# Patient Record
Sex: Male | Born: 1955
Health system: Southern US, Community
[De-identification: ages and names within clinical notes are randomized; demographics above are authoritative.]

## PROBLEM LIST (undated history)

## (undated) DIAGNOSIS — A150 Tuberculosis of lung: Secondary | ICD-10-CM

## (undated) DIAGNOSIS — M1990 Unspecified osteoarthritis, unspecified site: Secondary | ICD-10-CM

## (undated) HISTORY — DX: Unspecified osteoarthritis, unspecified site: M19.90

## (undated) HISTORY — PX: APPENDECTOMY: SHX54

## (undated) HISTORY — PX: HERNIA REPAIR: SHX51

---

## 1998-06-30 ENCOUNTER — Encounter: Payer: Self-pay | Admitting: Emergency Medicine

## 1998-06-30 ENCOUNTER — Emergency Department (HOSPITAL_COMMUNITY): Admission: EM | Admit: 1998-06-30 | Discharge: 1998-06-30 | Payer: Self-pay | Admitting: Emergency Medicine

## 2000-02-09 ENCOUNTER — Emergency Department (HOSPITAL_COMMUNITY): Admission: EM | Admit: 2000-02-09 | Discharge: 2000-02-09 | Payer: Self-pay

## 2000-06-29 ENCOUNTER — Emergency Department (HOSPITAL_COMMUNITY): Admission: EM | Admit: 2000-06-29 | Discharge: 2000-06-29 | Payer: Self-pay | Admitting: Emergency Medicine

## 2000-07-05 ENCOUNTER — Ambulatory Visit (HOSPITAL_COMMUNITY): Admission: RE | Admit: 2000-07-05 | Discharge: 2000-07-05 | Payer: Self-pay | Admitting: Obstetrics and Gynecology

## 2001-01-08 ENCOUNTER — Emergency Department (HOSPITAL_COMMUNITY): Admission: EM | Admit: 2001-01-08 | Discharge: 2001-01-08 | Payer: Self-pay | Admitting: Emergency Medicine

## 2002-04-15 ENCOUNTER — Emergency Department (HOSPITAL_COMMUNITY): Admission: EM | Admit: 2002-04-15 | Discharge: 2002-04-15 | Payer: Self-pay | Admitting: Emergency Medicine

## 2003-04-15 ENCOUNTER — Emergency Department (HOSPITAL_COMMUNITY): Admission: EM | Admit: 2003-04-15 | Discharge: 2003-04-15 | Payer: Self-pay | Admitting: Emergency Medicine

## 2003-09-28 ENCOUNTER — Emergency Department (HOSPITAL_COMMUNITY): Admission: EM | Admit: 2003-09-28 | Discharge: 2003-09-28 | Payer: Self-pay | Admitting: *Deleted

## 2004-02-15 ENCOUNTER — Emergency Department (HOSPITAL_COMMUNITY): Admission: EM | Admit: 2004-02-15 | Discharge: 2004-02-15 | Payer: Self-pay | Admitting: Family Medicine

## 2005-06-30 ENCOUNTER — Encounter: Admission: RE | Admit: 2005-06-30 | Discharge: 2005-06-30 | Payer: Self-pay | Admitting: Family Medicine

## 2006-03-19 ENCOUNTER — Emergency Department (HOSPITAL_COMMUNITY): Admission: EM | Admit: 2006-03-19 | Discharge: 2006-03-19 | Payer: Self-pay | Admitting: Emergency Medicine

## 2006-12-03 ENCOUNTER — Emergency Department (HOSPITAL_COMMUNITY): Admission: EM | Admit: 2006-12-03 | Discharge: 2006-12-03 | Payer: Self-pay | Admitting: Family Medicine

## 2008-01-23 ENCOUNTER — Emergency Department (HOSPITAL_COMMUNITY): Admission: EM | Admit: 2008-01-23 | Discharge: 2008-01-23 | Payer: Self-pay | Admitting: Family Medicine

## 2009-10-28 ENCOUNTER — Emergency Department (HOSPITAL_COMMUNITY): Admission: EM | Admit: 2009-10-28 | Discharge: 2009-10-28 | Payer: Self-pay | Admitting: Family Medicine

## 2010-01-05 ENCOUNTER — Emergency Department (HOSPITAL_COMMUNITY): Admission: EM | Admit: 2010-01-05 | Discharge: 2010-01-05 | Payer: Self-pay | Admitting: Family Medicine

## 2010-11-24 ENCOUNTER — Inpatient Hospital Stay (INDEPENDENT_AMBULATORY_CARE_PROVIDER_SITE_OTHER)
Admission: RE | Admit: 2010-11-24 | Discharge: 2010-11-24 | Disposition: A | Discharge status: Home / Self Care | Payer: Self-pay | Source: Ambulatory Visit | Attending: Emergency Medicine | Admitting: Emergency Medicine

## 2010-11-24 DIAGNOSIS — S335XXA Sprain of ligaments of lumbar spine, initial encounter: Secondary | ICD-10-CM

## 2012-09-04 ENCOUNTER — Encounter (HOSPITAL_COMMUNITY): Payer: Self-pay | Admitting: Emergency Medicine

## 2012-09-04 ENCOUNTER — Emergency Department (HOSPITAL_COMMUNITY)
Admission: EM | Admit: 2012-09-04 | Discharge: 2012-09-04 | Disposition: A | Discharge status: Home / Self Care | Payer: No Typology Code available for payment source | Attending: Emergency Medicine | Admitting: Emergency Medicine

## 2012-09-04 ENCOUNTER — Emergency Department (HOSPITAL_COMMUNITY): Payer: No Typology Code available for payment source

## 2012-09-04 DIAGNOSIS — A150 Tuberculosis of lung: Secondary | ICD-10-CM | POA: Insufficient documentation

## 2012-09-04 DIAGNOSIS — S6990XA Unspecified injury of unspecified wrist, hand and finger(s), initial encounter: Secondary | ICD-10-CM | POA: Insufficient documentation

## 2012-09-04 DIAGNOSIS — S59909A Unspecified injury of unspecified elbow, initial encounter: Secondary | ICD-10-CM | POA: Insufficient documentation

## 2012-09-04 DIAGNOSIS — S79919A Unspecified injury of unspecified hip, initial encounter: Secondary | ICD-10-CM | POA: Insufficient documentation

## 2012-09-04 DIAGNOSIS — Y929 Unspecified place or not applicable: Secondary | ICD-10-CM | POA: Insufficient documentation

## 2012-09-04 DIAGNOSIS — S99919A Unspecified injury of unspecified ankle, initial encounter: Secondary | ICD-10-CM

## 2012-09-04 DIAGNOSIS — S79929A Unspecified injury of unspecified thigh, initial encounter: Secondary | ICD-10-CM | POA: Insufficient documentation

## 2012-09-04 DIAGNOSIS — Z8611 Personal history of tuberculosis: Secondary | ICD-10-CM | POA: Insufficient documentation

## 2012-09-04 DIAGNOSIS — M25569 Pain in unspecified knee: Secondary | ICD-10-CM

## 2012-09-04 DIAGNOSIS — T148XXA Other injury of unspecified body region, initial encounter: Secondary | ICD-10-CM

## 2012-09-04 DIAGNOSIS — Y939 Activity, unspecified: Secondary | ICD-10-CM | POA: Insufficient documentation

## 2012-09-04 HISTORY — DX: Tuberculosis of lung: A15.0

## 2012-09-04 MED ORDER — OXYCODONE-ACETAMINOPHEN 5-325 MG PO TABS
2.0000 | ORAL_TABLET | Freq: Once | ORAL | Status: DC
  Filled 2012-09-04: qty 2

## 2012-09-04 MED ORDER — IBUPROFEN 800 MG PO TABS
800.0000 mg | ORAL_TABLET | Freq: Once | ORAL | Status: AC
  Administered 2012-09-04: 800 mg via ORAL
  Filled 2012-09-04: qty 1

## 2012-09-04 MED ORDER — OXYCODONE-ACETAMINOPHEN 5-325 MG PO TABS: 1.0000 | ORAL_TABLET | Freq: Four times a day (QID) | ORAL | Status: DC | PRN

## 2012-09-04 NOTE — ED Notes (Signed)
rx x 1 given for percocet- pt has family at bedside
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
## 2012-09-04 NOTE — ED Provider Notes (Signed)
  Medical screening examination/treatment/procedure(s) were performed by non-physician practitioner and as supervising physician I was immediately available for consultation/collaboration.    Gerhard Munch, MD 09/04/12 1540
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
## 2012-09-05 NOTE — Progress Notes (Signed)
During 09/04/12 ED visit pt noted without pcp nor insurance coverage.  Pt referred to [facility] liaison who provided pt with information on [place] self pay pcps and health reform information Pt also assisted to obtain an application and appointment for "orange card" for 09/10/12 at 1430

## 2012-10-16 ENCOUNTER — Encounter (HOSPITAL_COMMUNITY): Payer: Self-pay

## 2012-10-16 ENCOUNTER — Ambulatory Visit (HOSPITAL_COMMUNITY)
Admission: RE | Admit: 2012-10-16 | Discharge: 2012-10-16 | Disposition: A | Discharge status: Home / Self Care | Payer: Self-pay | Source: Ambulatory Visit | Attending: Internal Medicine | Admitting: Internal Medicine

## 2012-10-16 ENCOUNTER — Emergency Department (HOSPITAL_COMMUNITY)
Admission: EM | Admit: 2012-10-16 | Discharge: 2012-10-16 | Disposition: A | Discharge status: Home / Self Care | Payer: Self-pay | Source: Home / Self Care

## 2012-10-16 DIAGNOSIS — M7918 Myalgia, other site: Secondary | ICD-10-CM

## 2012-10-16 DIAGNOSIS — IMO0001 Reserved for inherently not codable concepts without codable children: Secondary | ICD-10-CM

## 2012-10-16 MED ORDER — IBUPROFEN 600 MG PO TABS: 600.0000 mg | ORAL_TABLET | Freq: Three times a day (TID) | ORAL | Status: DC | PRN
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
## 2012-10-16 NOTE — ED Notes (Signed)
Patient suffers from pain in arm and shuoulder Can sleep on either shoulder at night Lump under tongue on right side

## 2012-10-31 ENCOUNTER — Ambulatory Visit (HOSPITAL_COMMUNITY)
Admission: RE | Admit: 2012-10-31 | Discharge: 2012-10-31 | Disposition: A | Discharge status: Home / Self Care | Payer: Self-pay | Source: Ambulatory Visit | Attending: Internal Medicine | Admitting: Internal Medicine

## 2012-10-31 ENCOUNTER — Encounter (HOSPITAL_COMMUNITY): Payer: Self-pay

## 2012-10-31 ENCOUNTER — Emergency Department (HOSPITAL_COMMUNITY)
Admission: EM | Admit: 2012-10-31 | Discharge: 2012-10-31 | Disposition: A | Discharge status: Home / Self Care | Payer: Self-pay | Source: Home / Self Care

## 2012-10-31 DIAGNOSIS — M25519 Pain in unspecified shoulder: Secondary | ICD-10-CM | POA: Insufficient documentation

## 2012-10-31 DIAGNOSIS — M7918 Myalgia, other site: Secondary | ICD-10-CM

## 2012-10-31 DIAGNOSIS — IMO0001 Reserved for inherently not codable concepts without codable children: Secondary | ICD-10-CM

## 2012-10-31 MED ORDER — IBUPROFEN 600 MG PO TABS: 600.0000 mg | ORAL_TABLET | Freq: Three times a day (TID) | ORAL | Status: DC | PRN

## 2012-10-31 NOTE — ED Notes (Signed)
Follow up-bilateral shoulder pain
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
## 2012-12-17 ENCOUNTER — Telehealth: Payer: Self-pay | Admitting: General Practice

## 2012-12-17 NOTE — Telephone Encounter (Signed)
Pt would like refill for pain meds prescribed on 10/31/12 before 5/31 follow up.  Says ibuprofen not working, can't sleep because of pain.

## 2012-12-18 NOTE — Telephone Encounter (Signed)
Patient needs to be seen Will not change pain medication until he has an office visit

## 2013-07-11 ENCOUNTER — Encounter (HOSPITAL_COMMUNITY): Payer: Self-pay | Admitting: Emergency Medicine

## 2013-07-11 ENCOUNTER — Emergency Department (INDEPENDENT_AMBULATORY_CARE_PROVIDER_SITE_OTHER)
Admission: EM | Admit: 2013-07-11 | Discharge: 2013-07-11 | Disposition: A | Discharge status: Home / Self Care | Payer: Self-pay | Source: Home / Self Care | Attending: Family Medicine | Admitting: Family Medicine

## 2013-07-11 DIAGNOSIS — J069 Acute upper respiratory infection, unspecified: Secondary | ICD-10-CM

## 2013-07-11 MED ORDER — METHYLPREDNISOLONE SODIUM SUCC 125 MG IJ SOLR
INTRAMUSCULAR | Status: AC
  Filled 2013-07-11: qty 2

## 2013-07-11 MED ORDER — HYDROCOD POLST-CHLORPHEN POLST 10-8 MG/5ML PO LQCR: 5.0000 mL | Freq: Every evening | ORAL | Status: DC | PRN

## 2013-07-11 MED ORDER — METHYLPREDNISOLONE SODIUM SUCC 125 MG IJ SOLR
80.0000 mg | Freq: Once | INTRAMUSCULAR | Status: AC
Start: 2013-07-11 — End: 2013-07-11
  Administered 2013-07-11: 80 mg via INTRAMUSCULAR

## 2013-07-11 MED ORDER — BENZONATATE 100 MG PO CAPS: 100.0000 mg | ORAL_CAPSULE | Freq: Three times a day (TID) | ORAL | Status: DC

## 2013-07-11 NOTE — ED Notes (Signed)
C/o 1 week duration of URI , minimal relief w OTC medications

## 2013-07-11 NOTE — ED Provider Notes (Signed)
CSN: [id number]     Arrival date & time 07/11/13  1191 History   First MD Initiated Contact with Patient 07/11/13 1202     Chief Complaint  Patient presents with  . URI    Patient is a 57 y.o. male presenting with URI. The history is provided by the patient.  URI Presenting symptoms: congestion, cough and rhinorrhea   Presenting symptoms: no ear pain, no facial pain, no fatigue, no fever and no sore throat   Severity:  Moderate Onset quality:  Gradual Duration:  1 week Timing:  Constant Progression:  Unchanged Chronicity:  New Relieved by:  Nothing Ineffective treatments:  OTC medications Associated symptoms: headaches and sneezing   Associated symptoms: no arthralgias, no neck pain, no sinus pain, no swollen glands and no wheezing   Risk factors: not elderly, no chronic cardiac disease, no chronic kidney disease, no chronic respiratory disease, no diabetes mellitus, no immunosuppression, no recent illness, no recent travel and no sick contacts   1 Week h/o URI sx's that have persisted and have not responded to OTC remedies. Denies fever, body aches, facial pain or ear pain. Reports a  dry cough that is worse at night. Describes copiopus nasal secretions and PND.  Denies N/V/D or other associated symptoms.   Past Medical History  Diagnosis Date  . TB (pulmonary tuberculosis)    Past Surgical History  Procedure Laterality Date  . Appendectomy    . Hernia repair     Family History  Problem Relation Age of Onset  . Hypertension Mother   . Cancer Mother    History  Substance Use Topics  . Smoking status: Never Smoker   . Smokeless tobacco: Not on file  . Alcohol Use: Yes    Review of Systems  Constitutional: Negative for fever, chills and fatigue.  HENT: Positive for congestion, postnasal drip, rhinorrhea and sneezing. Negative for ear discharge, ear pain, sinus pressure, sore throat, trouble swallowing and voice change.   Eyes: Negative.   Respiratory: Positive for  cough. Negative for wheezing.   Cardiovascular: Negative.   Gastrointestinal: Negative.   Endocrine: Negative.   Genitourinary: Negative.   Musculoskeletal: Negative.  Negative for arthralgias and neck pain.  Allergic/Immunologic: Negative.   Neurological: Positive for headaches.  Hematological: Negative.   Psychiatric/Behavioral: Negative.     Allergies  Review of patient's allergies indicates no known allergies.  Home Medications   Current Outpatient Rx  Name  Route  Sig  Dispense  Refill  . benzonatate (TESSALON) 100 MG capsule   Oral   Take 1 capsule (100 mg total) by mouth every 8 (eight) hours.   21 capsule   0   . chlorpheniramine-HYDROcodone (TUSSIONEX PENNKINETIC ER) 10-8 MG/5ML LQCR   Oral   Take 5 mLs by mouth at bedtime as needed for cough.   30 mL   0   . ibuprofen (ADVIL,MOTRIN) 600 MG tablet   Oral   Take 1 tablet (600 mg total) by mouth every 8 (eight) hours as needed for pain.   30 tablet   0    BP 129/82  Pulse 69  Temp(Src) 98.4 F (36.9 C) (Oral)  SpO2 96% Physical Exam  Constitutional: He is oriented to person, place, and time. He appears well-developed and well-nourished.  HENT:  Head: Normocephalic and atraumatic.  Right Ear: Tympanic membrane, external ear and ear canal normal.  Left Ear: Tympanic membrane and ear canal normal.  Nose: Nose normal. Right sinus exhibits no maxillary sinus tenderness and  no frontal sinus tenderness. Left sinus exhibits no maxillary sinus tenderness and no frontal sinus tenderness.  Mouth/Throat: Uvula is midline, oropharynx is clear and moist and mucous membranes are normal.  Eyes: Conjunctivae are normal.  Neck: Normal range of motion. Neck supple.  Cardiovascular: Normal rate and regular rhythm.   Pulmonary/Chest: Effort normal and breath sounds normal.  Musculoskeletal: Normal range of motion.  Lymphadenopathy:    He has no cervical adenopathy.  Neurological: He is alert and oriented to person, place,  and time.  Skin: Skin is warm and dry.  Psychiatric: He has a normal mood and affect.    ED Course  Procedures (including critical care time) Labs Review Labs Reviewed - No data to display Imaging Review No results found.  EKG Interpretation    Date/Time:    Ventricular Rate:    PR Interval:    QRS Duration:   QT Interval:    QTC Calculation:   R Axis:     Text Interpretation:              MDM   1. URI, acute    1 wk h/o persistent URI sx's c/w viral URI. Solu-Medrol 80 mg given IM in office. Will treat w/ Tessalon Pearles, Tussionex for use at night and encourage rest, fluids and tylenol and Ibuprofen as needed. Pt agreeable w/ plan.    Leanne Chang, NP 07/11/13 1257  Medical screening examination/treatment/procedure(s) were performed by a resident physician or non-physician practitioner and as the supervising physician I was immediately available for consultation/collaboration.  Clementeen Graham, MD    Rodolph Bong, MD 07/12/13 [phone]

## 2013-11-21 ENCOUNTER — Ambulatory Visit: Payer: Self-pay

## 2014-01-14 ENCOUNTER — Ambulatory Visit: Payer: Self-pay | Admitting: Internal Medicine

## 2015-02-13 ENCOUNTER — Encounter (HOSPITAL_COMMUNITY): Payer: Self-pay | Admitting: *Deleted

## 2015-02-13 ENCOUNTER — Emergency Department (HOSPITAL_COMMUNITY)
Admission: EM | Admit: 2015-02-13 | Discharge: 2015-02-13 | Disposition: A | Discharge status: Home / Self Care | Payer: Worker's Compensation | Attending: Emergency Medicine | Admitting: Emergency Medicine

## 2015-02-13 DIAGNOSIS — Z8619 Personal history of other infectious and parasitic diseases: Secondary | ICD-10-CM | POA: Diagnosis not present

## 2015-02-13 DIAGNOSIS — W458XXA Other foreign body or object entering through skin, initial encounter: Secondary | ICD-10-CM | POA: Diagnosis not present

## 2015-02-13 DIAGNOSIS — Y998 Other external cause status: Secondary | ICD-10-CM | POA: Diagnosis not present

## 2015-02-13 DIAGNOSIS — Y9289 Other specified places as the place of occurrence of the external cause: Secondary | ICD-10-CM | POA: Diagnosis not present

## 2015-02-13 DIAGNOSIS — Z23 Encounter for immunization: Secondary | ICD-10-CM | POA: Diagnosis not present

## 2015-02-13 DIAGNOSIS — IMO0002 Reserved for concepts with insufficient information to code with codable children: Secondary | ICD-10-CM

## 2015-02-13 DIAGNOSIS — S51811A Laceration without foreign body of right forearm, initial encounter: Secondary | ICD-10-CM | POA: Insufficient documentation

## 2015-02-13 DIAGNOSIS — Y9389 Activity, other specified: Secondary | ICD-10-CM | POA: Diagnosis not present

## 2015-02-13 MED ORDER — IBUPROFEN 800 MG PO TABS: 800.0000 mg | ORAL_TABLET | Freq: Three times a day (TID) | ORAL | Status: DC

## 2015-02-13 MED ORDER — TETANUS-DIPHTH-ACELL PERTUSSIS 5-2.5-18.5 LF-MCG/0.5 IM SUSP
0.5000 mL | Freq: Once | INTRAMUSCULAR | Status: AC
  Administered 2015-02-13: 0.5 mL via INTRAMUSCULAR
  Filled 2015-02-13: qty 0.5

## 2015-02-13 MED ORDER — LIDOCAINE-EPINEPHRINE (PF) 2 %-1:200000 IJ SOLN
20.0000 mL | Freq: Once | INTRAMUSCULAR | Status: AC
  Administered 2015-02-13: 20 mL
  Filled 2015-02-13: qty 20

## 2015-02-13 NOTE — ED Provider Notes (Signed)
CSN: [id number]     Arrival date & time 02/13/15  1421 History  This chart was scribed for Brent General, PA-C, working with  by Steva Colder, ED Scribe. The patient was seen in room TR06C/TR06C at 3:25 PM.     Chief Complaint  Patient presents with  . Laceration      The history is provided by the patient. No language interpreter was used.    HPI Comments: Melvin Michael is a 59 y.o. male who presents to the Emergency Department complaining of laceration to left wrist onset PTA. He reports that he was trying to change out the plates on his trailer when his hand slipped and the plate cut his wrist. He denies color change, joint swelling, and any other symptoms. Pt is not UTD on his tetanus.  Past Medical History  Diagnosis Date  . TB (pulmonary tuberculosis)    Past Surgical History  Procedure Laterality Date  . Appendectomy    . Hernia repair     Family History  Problem Relation Age of Onset  . Hypertension Mother   . Cancer Mother    History  Substance Use Topics  . Smoking status: Never Smoker   . Smokeless tobacco: Not on file  . Alcohol Use: Yes    Review of Systems  Musculoskeletal: Negative for joint swelling.  Skin: Positive for wound. Negative for color change and rash.  Neurological: Negative for numbness.    Allergies  Review of patient's allergies indicates no known allergies.  Home Medications   Prior to Admission medications   Medication Sig Start Date End Date Taking? Authorizing Provider  benzonatate (TESSALON) 100 MG capsule Take 1 capsule (100 mg total) by mouth every 8 (eight) hours. 07/11/13   Rhetta Mura Schorr, NP  chlorpheniramine-HYDROcodone (TUSSIONEX PENNKINETIC ER) 10-8 MG/5ML LQCR Take 5 mLs by mouth at bedtime as needed for cough. 07/11/13   Rhetta Mura Schorr, NP  ibuprofen (ADVIL,MOTRIN) 600 MG tablet Take 1 tablet (600 mg total) by mouth every 8 (eight) hours as needed for pain. 10/31/12   Thurnell Lose, MD  ibuprofen (ADVIL,MOTRIN)  800 MG tablet Take 1 tablet (800 mg total) by mouth 3 (three) times daily. 02/13/15   Dahlia Bailiff, PA-C   BP 134/97 mmHg  Pulse 61  Temp(Src) 98 F (36.7 C) (Oral)  Resp 16  SpO2 100% Physical Exam  Constitutional: He is oriented to person, place, and time. He appears well-developed and well-nourished. No distress.  HENT:  Head: Normocephalic and atraumatic.  Eyes: EOM are normal.  Neck: Neck supple. No tracheal deviation present.  Cardiovascular: Normal rate.   Pulmonary/Chest: Effort normal. No respiratory distress.  Musculoskeletal: Normal range of motion.  Neurological: He is alert and oriented to person, place, and time.  Skin: Skin is warm and dry. Laceration noted.  4 cm laceration noted to the volar aspect of right distal forearm. Patient has full active and passive range of motion of elbow and wrist. Patient has full range of motion of fingers. 5 out of 5 motor strength noted at shoulder, elbow, wrist, grip. Radial pulse 2+.  Psychiatric: He has a normal mood and affect. His behavior is normal.  Nursing note and vitals reviewed.   ED Course  LACERATION REPAIR Date/Time: 02/13/2015 5:44 PM Performed by: Dahlia Bailiff Authorized by: Dahlia Bailiff Consent: Verbal consent obtained. Risks and benefits: risks, benefits and alternatives were discussed Consent given by: patient Patient identity confirmed: verbally with patient Time out: Immediately prior to procedure a "time  out" was called to verify the correct patient, procedure, equipment, support staff and site/side marked as required. Body area: upper extremity Location details: left lower arm Laceration length: 4 cm Foreign bodies: no foreign bodies Tendon involvement: none Nerve involvement: none Vascular damage: no Local anesthetic: lidocaine 2% with epinephrine Anesthetic total: 5 ml Patient sedated: no Preparation: Patient was prepped and draped in the usual sterile fashion. Irrigation solution: saline Irrigation  method: jet lavage and syringe Amount of cleaning: standard Debridement: none Degree of undermining: none Skin closure: 4-0 Prolene Fascia closure: 4-0 Vicryl Number of sutures: 7 Technique: simple Approximation: close Approximation difficulty: complex Dressing: 4x4 sterile gauze Patient tolerance: Patient tolerated the procedure well with no immediate complications Comments: 4-0 Vicryls rapide #1 placed deep and fascia. 4-0 #6 proline sutures placed in the skin superficially   (including critical care time) DIAGNOSTIC STUDIES: Oxygen Saturation is 100% on RA, nl by my interpretation.    COORDINATION OF CARE: 3:28 PM-Discussed treatment plan which includes laceration repair with pt at bedside and pt agreed to plan.   Labs Review Labs Reviewed - No data to display  Imaging Review No results found.   EKG Interpretation None      MDM   Final diagnoses:  Laceration of skin   Tdap booster given. Wound cleaning complete with pressure irrigation, bottom of wound visualized, no foreign bodies appreciated. Laceration occurred < 8 hours prior to repair which was well tolerated. Pt has no co morbidities to effect normal wound healing. Discussed suture home care w pt and answered questions. Pt to f-u for wound check and suture removal in 7 days. Pt is hemodynamically stable w no complaints prior to dc.   I personally performed the services described in this documentation, which was scribed in my presence. The recorded information has been reviewed and is accurate.  BP 134/97 mmHg  Pulse 61  Temp(Src) 98 F (36.7 C) (Oral)  Resp 16  SpO2 100%  Signed,  Dahlia Bailiff, PA-C 5:48 PM    Dahlia Bailiff, PA-C 02/13/15 1748  Ripley Fraise, MD 02/14/15 [phone]

## 2015-02-13 NOTE — ED Notes (Signed)
Suture cart at bedside 

## 2015-02-13 NOTE — ED Notes (Signed)
Declined W/C at D/C and was escorted to lobby by RN. 

## 2015-02-13 NOTE — Discharge Instructions (Signed)
Sutured Wound Care Sutures are stitches that can be used to close wounds. Wound care helps prevent pain and infection.  HOME CARE INSTRUCTIONS   Rest and elevate the injured area until all the pain and swelling are gone.  Only take over-the-counter or prescription medicines for pain, discomfort, or fever as directed by your caregiver.  After 48 hours, gently wash the area with mild soap and water once a day, or as directed. Rinse off the soap. Pat the area dry with a clean towel. Do not rub the wound. This may cause bleeding.  Follow your caregiver's instructions for how often to change the bandage (dressing). Stop using a dressing after 2 days or after the wound stops draining.  If the dressing sticks, moisten it with soapy water and gently remove it.  Apply ointment on the wound as directed.  Avoid stretching a sutured wound.  Drink enough fluids to keep your urine clear or pale yellow.  Follow up with your caregiver for suture removal as directed.  Use sunscreen on your wound for the next 3 to 6 months so the scar will not darken. SEEK IMMEDIATE MEDICAL CARE IF:   Your wound becomes red, swollen, hot, or tender.  You have increasing pain in the wound.  You have a red streak that extends from the wound.  There is pus coming from the wound.  You have a fever.  You have shaking chills.  There is a bad smell coming from the wound.  You have persistent bleeding from the wound. MAKE SURE YOU:   Understand these instructions.  Will watch your condition.  Will get help right away if you are not doing well or get worse. Document Released: 08/24/2004 Document Revised: 10/09/2011 Document Reviewed: 11/20/2010 [facility] Patient Information 2015 [place], [place]. This information is not intended to replace advice given to you by your health care provider. Make sure you discuss any questions you have with your health care provider.  Laceration Care, Adult A laceration is a cut  or lesion that goes through all layers of the skin and into the tissue just beneath the skin. TREATMENT  Some lacerations may not require closure. Some lacerations may not be able to be closed due to an increased risk of infection. It is important to see your caregiver as soon as possible after an injury to minimize the risk of infection and maximize the opportunity for successful closure. If closure is appropriate, pain medicines may be given, if needed. The wound will be cleaned to help prevent infection. Your caregiver will use stitches (sutures), staples, wound glue (adhesive), or skin adhesive strips to repair the laceration. These tools bring the skin edges together to allow for faster healing and a better cosmetic outcome. However, all wounds will heal with a scar. Once the wound has healed, scarring can be minimized by covering the wound with sunscreen during the day for 1 full year. HOME CARE INSTRUCTIONS  For sutures or staples:  Keep the wound clean and dry.  If you were given a bandage (dressing), you should change it at least once a day. Also, change the dressing if it becomes wet or dirty, or as directed by your caregiver.  Wash the wound with soap and water 2 times a day. Rinse the wound off with water to remove all soap. Pat the wound dry with a clean towel.  After cleaning, apply a thin layer of the antibiotic ointment as recommended by your caregiver. This will help prevent infection and keep  the dressing from sticking.  You may shower as usual after the first 24 hours. Do not soak the wound in water until the sutures are removed.  Only take over-the-counter or prescription medicines for pain, discomfort, or fever as directed by your caregiver.  Get your sutures or staples removed as directed by your caregiver. For skin adhesive strips:  Keep the wound clean and dry.  Do not get the skin adhesive strips wet. You may bathe carefully, using caution to keep the wound dry.  If  the wound gets wet, pat it dry with a clean towel.  Skin adhesive strips will fall off on their own. You may trim the strips as the wound heals. Do not remove skin adhesive strips that are still stuck to the wound. They will fall off in time. For wound adhesive:  You may briefly wet your wound in the shower or bath. Do not soak or scrub the wound. Do not swim. Avoid periods of heavy perspiration until the skin adhesive has fallen off on its own. After showering or bathing, gently pat the wound dry with a clean towel.  Do not apply liquid medicine, cream medicine, or ointment medicine to your wound while the skin adhesive is in place. This may loosen the film before your wound is healed.  If a dressing is placed over the wound, be careful not to apply tape directly over the skin adhesive. This may cause the adhesive to be pulled off before the wound is healed.  Avoid prolonged exposure to sunlight or tanning lamps while the skin adhesive is in place. Exposure to ultraviolet light in the first year will darken the scar.  The skin adhesive will usually remain in place for 5 to 10 days, then naturally fall off the skin. Do not pick at the adhesive film. You may need a tetanus shot if:  You cannot remember when you had your last tetanus shot.  You have never had a tetanus shot. If you get a tetanus shot, your arm may swell, get red, and feel warm to the touch. This is common and not a problem. If you need a tetanus shot and you choose not to have one, there is a rare chance of getting tetanus. Sickness from tetanus can be serious. SEEK MEDICAL CARE IF:   You have redness, swelling, or increasing pain in the wound.  You see a red line that goes away from the wound.  You have yellowish-white fluid (pus) coming from the wound.  You have a fever.  You notice a bad smell coming from the wound or dressing.  Your wound breaks open before or after sutures have been removed.  You notice something  coming out of the wound such as wood or glass.  Your wound is on your hand or foot and you cannot move a finger or toe. SEEK IMMEDIATE MEDICAL CARE IF:   Your pain is not controlled with prescribed medicine.  You have severe swelling around the wound causing pain and numbness or a change in color in your arm, hand, leg, or foot.  Your wound splits open and starts bleeding.  You have worsening numbness, weakness, or loss of function of any joint around or beyond the wound.  You develop painful lumps near the wound or on the skin anywhere on your body. MAKE SURE YOU:   Understand these instructions.  Will watch your condition.  Will get help right away if you are not doing well or get worse. Document Released:  07/17/2005 Document Revised: 10/09/2011 Document Reviewed: 01/10/2011 ExitCare Patient Information 2015 [place], [place]. This information is not intended to replace advice given to you by your health care provider. Make sure you discuss any questions you have with your health care provider.   Emergency Department Resource Guide 1) Find a Doctor and Pay Out of Pocket Although you won't have to find out who is covered by your insurance plan, it is a good idea to ask around and get recommendations. You will then need to call the office and see if the doctor you have chosen will accept you as a new patient and what types of options they offer for patients who are self-pay. Some doctors offer discounts or will set up payment plans for their patients who do not have insurance, but you will need to ask so you aren't surprised when you get to your appointment.  2) Contact Your Local Health Department Not all health departments have doctors that can see patients for sick visits, but many do, so it is worth a call to see if yours does. If you don't know where your local health department is, you can check in your phone book. The CDC also has a tool to help you locate your state's health  department, and many state websites also have listings of all of their local health departments.  3) Find a [facility] If your illness is not likely to be very severe or complicated, you may want to try a walk in clinic. These are popping up all over the country in pharmacies, drugstores, and shopping centers. They're usually staffed by nurse practitioners or physician assistants that have been trained to treat common illnesses and complaints. They're usually fairly quick and inexpensive. However, if you have serious medical issues or chronic medical problems, these are probably not your best option.  No Primary Care Doctor: - Call Health Connect at  [phone] - they can help you locate a primary care doctor that  accepts your insurance, provides certain services, etc. - Physician Referral Service- [phone]  Chronic Pain Problems: Organization         Address  Phone   Notes  [facility]  [phone] Patients need to be referred by their primary care doctor.   Medication Assistance: Organization         Address  Phone   Notes  [facility] Medication [facility] [address] [place], [place] [address] [phone] --Must be a resident of [facility] [place] -- Must have NO insurance coverage whatsoever (no Medicaid/ Medicare, etc.) -- The pt. MUST have a primary care doctor that directs their care regularly and follows them in the community   MedAssist  [phone]   [employer]  [phone]    Agencies that provide inexpensive medical care: Organization         Address  Phone   Notes  [place]  [phone]   [facility]    [phone]   [facility] [address], [place] [address] [phone]   [place] [address], [place] [phone]   Planned Parenthood    [phone]   [facility]    [phone]    [address], [place] Phone:  [phone], Fax:  [phone] Hours of Operation:  9 am - 6 pm, M-F.  Also  accepts Medicaid/Medicare and self-pay.  [facility] for [address] [place], [address], [place] Phone: [phone], Fax: [phone]. Hours of Operation:  8:30 am - 5:30 pm, M-F.  Also accepts Medicaid and self-pay.  [facility] [place] [address], [place] Phone: [phone]   [place], [place], [place] [phone], Ext. [phone] Mondays & Thursdays: 7-9 AM.  First 15 patients are seen on a first come, first serve basis.    [place] Providers:  Organization         Address  Phone   Notes  [facility] [address], [place] [phone] Also accepts self-pay patients.  [facility] [address], [place]  [phone]   [place], [address], [place] [phone]   [facility] [address] [place] [phone]   Lucianne Lei [address], [place]   [phone] Only accepts [place] Access [place] patients after they have their name applied to their card.   Self-Pay (no insurance) in [facility]:  Organization         Address  Phone   Notes  Sickle Cell Patients, [facility] [place] [phone]   [facility] [place] [phone]   [facility] [place]  [place], [place],  [phone]   [facility]/Dr. Osei-Bonsu  [address] [place] or [address], [place] [phone] Phone number for both [place] and [place] locations is the same.  Urgent Medical and [facility] [address] [place] [phone]    [facility] [address], [place] or [address] [phone]   [facility] [address], [place] [phone], phone; [phone], fax Sees patients 1st and 3rd Saturday of every month.  Must not qualify for public or private insurance (i.e. Medicaid, Medicare, [place] Health Choice, Veterans' Benefits)  Household income should be no more than 200% of the poverty level The clinic cannot treat you if you are pregnant or think you are pregnant  Sexually transmitted diseases are not treated at the clinic.    Dental Care: Organization         Address  Phone  Notes  [facility] Department of [facility] [place] [phone] Accepts children up to age 47 who are enrolled in [place] or [place]; pregnant women with a Medicaid card; and children who have applied for Medicaid or [place] Health Choice, but were declined, whose parents can pay a reduced fee at time of service.  [facility] Department of [facility]  [address], [place] [phone] Accepts children up to age 7 who are enrolled in [place] or [place]; pregnant women with a Medicaid card; and children who have applied for Medicaid or [place] Health Choice, but were declined, whose parents can pay a reduced fee at time of service.  [place] Adult [facility] Access PROGRAM  [place] [phone] Patients are seen by appointment only. Walk-ins are not accepted. [place] will see patients 51 years of age and older. Monday - Tuesday (8am-5pm) Most  Wednesdays (8:30-5pm) $30 per visit, cash only  [facility] Adult Dental Access PROGRAM  [address], [facility] [phone] Patients are seen by appointment only. Walk-ins are not accepted. [place] will see patients 66 years of age and older. One Wednesday Evening (Monthly: Volunteer Based).   $30 per visit, cash only  [place]  [phone] for adults; Children under age 35, call Graduate Pediatric Dentistry at [phone]. Children aged 49-14, please call [phone] to request a pediatric application.  Dental services are provided in all areas of dental care including fillings, crowns and bridges, complete and partial dentures, implants, gum treatment, root canals, and extractions. Preventive care is also provided. Treatment is provided to both adults and children. Patients are selected via a lottery and there is often a waiting list.   [facility] [address] [place]  [phone] [email or web address]   [facility] [address] [place], [place] [phone], Ext. [phone] Second and Fourth Thursday of each month, opens at 6:30 AM; Clinic ends at 9 AM.  Patients are seen on a first-come first-served basis, and a limited number are seen during each clinic.   [facility]  [address] Hillard Danker [place], [place] [phone]   Eligibility Requirements You must have lived in [place], [place], or [place] counties for at least the last three months.   You cannot be eligible for state or federal sponsored [employer], including [employer], [place], or [employer].   You generally cannot be eligible for healthcare insurance through your employer.    How to apply: Eligibility screenings are held every Tuesday and Wednesday afternoon from 1:00 pm until 4:00 pm. You do not need an appointment for the interview!  [facility] [address], [place], [place]   [place]  [place] Department  [place]  [phone]    Behavioral Health Resources in the Community: Intensive Outpatient Programs Organization         Address  Phone  Notes  [place]. [address] [place], [place] [phone]   [facility] Outpatient [address] [place], [place]   ADS: Alcohol & Drug Svcs [address] [place], [place]   [place] [address]  [place], [place] or [phone]   Substance Abuse Resources Organization         Address  Phone  Notes  Alcohol and Drug Services  [phone]   [place]  [phone]   [facility] [place]   Chinita Pester  [phone]   Residential & Outpatient Substance Abuse Program  [phone]   Psychological Services Organization         Address  Phone  Notes  [facility] [place]  [place]  [phone]   [place] [address] [place] or [phone]    Mobile Crisis Teams Organization         Address  Phone  Notes  [facility], Mobile Crisis Care Unit  [phone]   [facility]  [address] [place], [place]   [place] [address] [place] [phone]    Self-Help/Support Groups Organization         Address  Phone  Notes  [facility]ental [facility]oc[facility] of [place] - variety of support groups  [place] Call for more information  Narcotics Anonymous (NA), [facility] [address], [employer] [place]  2 meetings at this location   Special educational needs teacher         Address  Phone  Notes  [facility] [place],    [place]  [phone]   [facility]  [address] [place] [id number], [place], [place]ia   [place], [place] [phone] Admissions: 8am-3pm M-F  Incentives Substance [place] [address],    [place], [place] [phone]   [facility] [address] [place], [place], [place]   [facility] [address],  [place], [place]   [facility] Programs - Intensive Outpatient [address] Kristeen [address], [place], [place]   [facility] [place].,  [place], [place] [phone] or [phone]   [facility]idential Treatment Services (RTS) [address] [place], [place] Accepts Medicaid  [facility] [place] [address],  [employer] [place] [phone] Substance Abuse/Addiction Treatment   [facility] Organization         Address  Phone  Notes  [facility]  [phone]   Domenic Schwab, PhD [address] Arlis Porta [place], [place]   [phone] or [phone]   [place], [place] [phone]   [facility] [address] [place], [place] [phone] Insurance/Medicaid/sponsorship through [facility] and Families [address] [place]                                    [place], [place] [phone] [place] [address], [place] [phone]    Dr. Adele Schilder  [phone]   [facility] Dept. 1) [address] [place] 2) [place] 3)  [place] [address], [place] [phone]  [phone]   [place] [phone] or [phone] (After Hours)

## 2015-02-13 NOTE — ED Notes (Signed)
Pt has approx 1 inch laceration to left anterior wrist, minimal bleeding noted at triage. Pt able to move all fingers and denies any numbness. Unknown tetanus.

## 2015-02-20 ENCOUNTER — Encounter (HOSPITAL_COMMUNITY): Payer: Self-pay | Admitting: Emergency Medicine

## 2015-02-20 ENCOUNTER — Emergency Department (HOSPITAL_COMMUNITY)
Admission: EM | Admit: 2015-02-20 | Discharge: 2015-02-20 | Disposition: A | Discharge status: Home / Self Care | Payer: Self-pay | Attending: Emergency Medicine | Admitting: Emergency Medicine

## 2015-02-20 DIAGNOSIS — Z79899 Other long term (current) drug therapy: Secondary | ICD-10-CM | POA: Insufficient documentation

## 2015-02-20 DIAGNOSIS — Z8611 Personal history of tuberculosis: Secondary | ICD-10-CM | POA: Insufficient documentation

## 2015-02-20 DIAGNOSIS — Z4802 Encounter for removal of sutures: Secondary | ICD-10-CM | POA: Insufficient documentation

## 2015-02-20 DIAGNOSIS — Z5189 Encounter for other specified aftercare: Secondary | ICD-10-CM

## 2015-02-20 DIAGNOSIS — Z791 Long term (current) use of non-steroidal anti-inflammatories (NSAID): Secondary | ICD-10-CM | POA: Insufficient documentation

## 2015-02-20 DIAGNOSIS — M5432 Sciatica, left side: Secondary | ICD-10-CM | POA: Insufficient documentation

## 2015-02-20 MED ORDER — IBUPROFEN 400 MG PO TABS
600.0000 mg | ORAL_TABLET | Freq: Once | ORAL | Status: AC
  Administered 2015-02-20: 600 mg via ORAL
  Filled 2015-02-20: qty 2

## 2015-02-20 MED ORDER — IBUPROFEN 600 MG PO TABS: 600.0000 mg | ORAL_TABLET | Freq: Four times a day (QID) | ORAL | Status: DC | PRN

## 2015-02-20 MED ORDER — CYCLOBENZAPRINE HCL 5 MG PO TABS: 5.0000 mg | ORAL_TABLET | Freq: Two times a day (BID) | ORAL | Status: DC | PRN

## 2015-02-20 NOTE — Discharge Instructions (Signed)
Sciatica °Sciatica is pain, weakness, numbness, or tingling along the path of the sciatic nerve. The nerve starts in the lower back and runs down the back of each leg. The nerve controls the muscles in the lower leg and in the back of the knee, while also providing sensation to the back of the thigh, lower leg, and the sole of your foot. Sciatica is a symptom of another medical condition. For instance, nerve damage or certain conditions, such as a herniated disk or bone spur on the spine, pinch or put pressure on the sciatic nerve. This causes the pain, weakness, or other sensations normally associated with sciatica. Generally, sciatica only affects one side of the body. °CAUSES  °· Herniated or slipped disc. °· Degenerative disk disease. °· A pain disorder involving the narrow muscle in the buttocks (piriformis syndrome). °· Pelvic injury or fracture. °· Pregnancy. °· Tumor (rare). °SYMPTOMS  °Symptoms can vary from mild to very severe. The symptoms usually travel from the low back to the buttocks and down the back of the leg. Symptoms can include: °· Mild tingling or dull aches in the lower back, leg, or hip. °· Numbness in the back of the calf or sole of the foot. °· Burning sensations in the lower back, leg, or hip. °· Sharp pains in the lower back, leg, or hip. °· Leg weakness. °· Severe back pain inhibiting movement. °These symptoms may get worse with coughing, sneezing, laughing, or prolonged sitting or standing. Also, being overweight may worsen symptoms. °DIAGNOSIS  °Your caregiver will perform a physical exam to look for common symptoms of sciatica. He or she may ask you to do certain movements or activities that would trigger sciatic nerve pain. Other tests may be performed to find the cause of the sciatica. These may include: °· Blood tests. °· X-rays. °· Imaging tests, such as an MRI or CT scan. °TREATMENT  °Treatment is directed at the cause of the sciatic pain. Sometimes, treatment is not necessary  and the pain and discomfort goes away on its own. If treatment is needed, your caregiver may suggest: °· Over-the-counter medicines to relieve pain. °· Prescription medicines, such as anti-inflammatory medicine, muscle relaxants, or narcotics. °· Applying heat or ice to the painful area. °· Steroid injections to lessen pain, irritation, and inflammation around the nerve. °· Reducing activity during periods of pain. °· Exercising and stretching to strengthen your abdomen and improve flexibility of your spine. Your caregiver may suggest losing weight if the extra weight makes the back pain worse. °· Physical therapy. °· Surgery to eliminate what is pressing or pinching the nerve, such as a bone spur or part of a herniated disk. °HOME CARE INSTRUCTIONS  °· Only take over-the-counter or prescription medicines for pain or discomfort as directed by your caregiver. °· Apply ice to the affected area for 20 minutes, 3-4 times a day for the first 48-72 hours. Then try heat in the same way. °· Exercise, stretch, or perform your usual activities if these do not aggravate your pain. °· Attend physical therapy sessions as directed by your caregiver. °· Keep all follow-up appointments as directed by your caregiver. °· Do not wear high heels or shoes that do not provide proper support. °· Check your mattress to see if it is too soft. A firm mattress may lessen your pain and discomfort. °SEEK IMMEDIATE MEDICAL CARE IF:  °· You lose control of your bowel or bladder (incontinence). °· You have increasing weakness in the lower back, pelvis, buttocks,   or legs.  You have redness or swelling of your back.  You have a burning sensation when you urinate.  You have pain that gets worse when you lie down or awakens you at night.  Your pain is worse than you have experienced in the past.  Your pain is lasting longer than 4 weeks.  You are suddenly losing weight without reason. MAKE SURE YOU:  Understand these  instructions.  Will watch your condition.  Will get help right away if you are not doing well or get worse. Document Released: 07/11/2001 Document Revised: 01/16/2012 Document Reviewed: 11/26/2011 [facility] Patient Information 2015 [place]. This information is not intended to replace advice given to you by your health care provider. Make sure you discuss any questions you have with your health care provider. Laceration Care, Adult A laceration is a cut or lesion that goes through all layers of the skin and into the tissue just beneath the skin. TREATMENT  Some lacerations may not require closure. Some lacerations may not be able to be closed due to an increased risk of infection. It is important to see your caregiver as soon as possible after an injury to minimize the risk of infection and maximize the opportunity for successful closure. If closure is appropriate, pain medicines may be given, if needed. The wound will be cleaned to help prevent infection. Your caregiver will use stitches (sutures), staples, wound glue (adhesive), or skin adhesive strips to repair the laceration. These tools bring the skin edges together to allow for faster healing and a better cosmetic outcome. However, all wounds will heal with a scar. Once the wound has healed, scarring can be minimized by covering the wound with sunscreen during the day for 1 full year. HOME CARE INSTRUCTIONS  For sutures or staples:  Keep the wound clean and dry.  If you were given a bandage (dressing), you should change it at least once a day. Also, change the dressing if it becomes wet or dirty, or as directed by your caregiver.  Wash the wound with soap and water 2 times a day. Rinse the wound off with water to remove all soap. Pat the wound dry with a clean towel.  After cleaning, apply a thin layer of the antibiotic ointment as recommended by your caregiver. This will help prevent infection and keep the dressing from  sticking.  You may shower as usual after the first 24 hours. Do not soak the wound in water until the sutures are removed.  Only take over-the-counter or prescription medicines for pain, discomfort, or fever as directed by your caregiver.  Get your sutures or staples removed as directed by your caregiver. For skin adhesive strips:  Keep the wound clean and dry.  Do not get the skin adhesive strips wet. You may bathe carefully, using caution to keep the wound dry.  If the wound gets wet, pat it dry with a clean towel.  Skin adhesive strips will fall off on their own. You may trim the strips as the wound heals. Do not remove skin adhesive strips that are still stuck to the wound. They will fall off in time. For wound adhesive:  You may briefly wet your wound in the shower or bath. Do not soak or scrub the wound. Do not swim. Avoid periods of heavy perspiration until the skin adhesive has fallen off on its own. After showering or bathing, gently pat the wound dry with a clean towel.  Do not apply liquid medicine, cream medicine,  or ointment medicine to your wound while the skin adhesive is in place. This may loosen the film before your wound is healed.  If a dressing is placed over the wound, be careful not to apply tape directly over the skin adhesive. This may cause the adhesive to be pulled off before the wound is healed.  Avoid prolonged exposure to sunlight or tanning lamps while the skin adhesive is in place. Exposure to ultraviolet light in the first year will darken the scar.  The skin adhesive will usually remain in place for 5 to 10 days, then naturally fall off the skin. Do not pick at the adhesive film. You may need a tetanus shot if:  You cannot remember when you had your last tetanus shot.  You have never had a tetanus shot. If you get a tetanus shot, your arm may swell, get red, and feel warm to the touch. This is common and not a problem. If you need a tetanus shot and  you choose not to have one, there is a rare chance of getting tetanus. Sickness from tetanus can be serious. SEEK MEDICAL CARE IF:   You have redness, swelling, or increasing pain in the wound.  You see a red line that goes away from the wound.  You have yellowish-white fluid (pus) coming from the wound.  You have a fever.  You notice a bad smell coming from the wound or dressing.  Your wound breaks open before or after sutures have been removed.  You notice something coming out of the wound such as wood or glass.  Your wound is on your hand or foot and you cannot move a finger or toe. SEEK IMMEDIATE MEDICAL CARE IF:   Your pain is not controlled with prescribed medicine.  You have severe swelling around the wound causing pain and numbness or a change in color in your arm, hand, leg, or foot.  Your wound splits open and starts bleeding.  You have worsening numbness, weakness, or loss of function of any joint around or beyond the wound.  You develop painful lumps near the wound or on the skin anywhere on your body. MAKE SURE YOU:   Understand these instructions.  Will watch your condition.  Will get help right away if you are not doing well or get worse. Document Released: 07/17/2005 Document Revised: 10/09/2011 Document Reviewed: 01/10/2011 [facility] Patient Information 2015 [place], [place]. This information is not intended to replace advice given to you by your health care provider. Make sure you discuss any questions you have with your health care provider.

## 2015-02-20 NOTE — ED Notes (Signed)
Pt. Stated, Melvin Michael had left side from back all the way to my foot, I think its my siactica, and I need stitches out of my left wrist.

## 2015-02-20 NOTE — ED Notes (Signed)
Pt is in stable condition upon d/c and ambulates from ED. 

## 2015-02-20 NOTE — ED Provider Notes (Signed)
CSN: [id number]     Arrival date & time 02/20/15  1406 History  This chart was scribed for non-physician practitioner, Melvin Haring, PA-C, working with Charlesetta Shanks, MD, by Stephania Fragmin, ED Scribe. This patient was seen in room TR07C/TR07C and the patient's care was started at 3:00 PM.    Chief Complaint  Patient presents with  . Back Pain  . Suture / Staple Removal   The history is provided by the patient. No language interpreter was used.   HPI Comments: Melvin Michael is a 59 y.o. 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 He states he doesn't feel the wound has healed. Patient has kept the area covered during the day to protect it from exposure to dust at work; he then uncovers it at night when he gets home but hasn't let it get much air.  He states he was complaining of left sided back pain radiating down his entire left leg to hit foot, which he attributes to his sciatica.  The patient denies diaphoresis, fever, headache, weakness (general or focal), confusion, change of vision,  neck pain, dysphagia, aphagia, chest pain, shortness of breath,  abdominal pains, nausea, vomiting, diarrhea, lower extremity swelling.  Past Medical History  Diagnosis Date  . TB (pulmonary tuberculosis)    Past Surgical History  Procedure Laterality Date  . Appendectomy    . Hernia repair     Family History  Problem Relation Age of Onset  . Hypertension Mother   . Cancer Mother    History  Substance Use Topics  . Smoking status: Never Smoker   . Smokeless tobacco: Not on file  . Alcohol Use: Yes    Review of Systems  Musculoskeletal: Positive for back pain.  Skin: Positive for wound.  A complete 10 system review of systems was obtained and all systems are negative except as noted in the HPI and PMH.    Allergies  Review of  patient's allergies indicates no known allergies.  Home Medications   Prior to Admission medications   Medication Sig Start Date End Date Taking? Authorizing Provider  benzonatate (TESSALON) 100 MG capsule Take 1 capsule (100 mg total) by mouth every 8 (eight) hours. 07/11/13   Rhetta Mura Schorr, NP  chlorpheniramine-HYDROcodone (TUSSIONEX PENNKINETIC ER) 10-8 MG/5ML LQCR Take 5 mLs by mouth at bedtime as needed for cough. 07/11/13   Rhetta Mura Schorr, NP  cyclobenzaprine (FLEXERIL) 5 MG tablet Take 1 tablet (5 mg total) by mouth 2 (two) times daily as needed for muscle spasms. 02/20/15   Ryker Pherigo Carlota Raspberry, PA-C  ibuprofen (ADVIL,MOTRIN) 600 MG tablet Take 1 tablet (600 mg total) by mouth every 8 (eight) hours as needed for pain. 10/31/12   Thurnell Lose, MD  ibuprofen (ADVIL,MOTRIN) 600 MG tablet Take 1 tablet (600 mg total) by mouth every 6 (six) hours as needed. 02/20/15   Rella Egelston Carlota Raspberry, PA-C  ibuprofen (ADVIL,MOTRIN) 800 MG tablet Take 1 tablet (800 mg total) by mouth 3 (three) times daily. 02/13/15   Dahlia Bailiff, PA-C   BP 163/101 mmHg  Pulse 62  Temp(Src) 98 F (36.7 C) (Oral)  Resp 20  SpO2 99% Physical Exam  Constitutional: He is oriented to person, place, and time. He appears well-developed and well-nourished. No distress.  HENT:  Head: Normocephalic and atraumatic.  Eyes: Conjunctivae and EOM are normal. Pupils  are equal, round, and reactive to light.  Neck: Normal range of motion. Neck supple. No tracheal deviation present.  Cardiovascular: Normal rate and regular rhythm.   Pulmonary/Chest: Effort normal. No respiratory distress.  Abdominal: Soft.  Musculoskeletal: Normal range of motion.  Pt has equal strength to bilateral lower extremities.  Neurosensory function adequate to both legs Skin color is normal. Skin is warm and moist.  I see no step off deformity, no midline bony tenderness.  Pt is able to ambulate.  No crepitus, laceration, effusion, induration, lesions,  swelling.   Pedal pulses are symmetrical and palpable bilaterally    Neurological: He is alert and oriented to person, place, and time.  Skin: Skin is warm and dry.  Laceration to left wrist has 7 intact sutures. They wound has not healed and the edges pull apart. The wound is dry, clean, no induration, drainage or tenderness to palpation.  Psychiatric: He has a normal mood and affect. His behavior is normal.  Nursing note and vitals reviewed.   ED Course  Procedures (including critical care time)  DIAGNOSTIC STUDIES: Oxygen Saturation is 99% on RA, normal by my interpretation.    COORDINATION OF CARE: 3:02 PM -  Suspect lagged healing time because pt has not allowed the wound to have enough exposure to air. No signs of wound infection. Discussed treatment plan with pt at bedside which includes keeping the wound covered with a breathable band-aid and f/u in 1 week for suture removal. Will also muscle relaxants for his back pain, which I suspect is due to sciatica. Pt verbalized understanding and agreed to plan.  MDM   Final diagnoses:  Visit for wound check  Sciatica, left   Patient will need to return in 1 week to have sutures removed. Advised to allow sutures to get a lot more air, continue to keep clean and not to put so much ointment on the area.   59 y.o.Melvin Michael's  with back pain. No neurological deficits and normal neuro exam. Patient can walk. No loss of bowel or bladder control. No concern for cauda equina at this time base on HPI and physical exam findings. No fever, night sweats, weight loss, h/o cancer, IVDU.   Patient Plan 1. Medications: NSAIDs and muscle relaxer. Cont usual home medications unless otherwise directed. 2. Treatment: rest, drink plenty of fluids, gentle stretching as discussed, alternate ice and heat  3. Follow Up: Please followup with your primary doctor for discussion of your diagnoses and further evaluation after today's visit; if you do not have  a primary care doctor use the resource guide provided to find one  Advised to follow-up with the orthopedist if symptoms do not start to resolve in the next 2-3 days. If develop loss of bowel or urinary control return to the ED as soon as possible for further evaluation. To take the medications as prescribed as they can cause harm if not taken appropriately.   Vital signs are stable at discharge. Filed Vitals:   02/20/15 1440  BP: 163/101  Pulse: 62  Temp: 98 F (36.7 C)  Resp: 20    Patient/guardian has voiced understanding and agreed to follow-up with the PCP or specialist.   I personally performed the services described in this documentation, which was scribed in my presence. The recorded information has been reviewed and is accurate.     Melvin Haring, PA-C 02/20/15 1516  Charlesetta Shanks, MD 02/28/15 [phone]

## 2015-02-27 ENCOUNTER — Encounter (HOSPITAL_COMMUNITY): Payer: Self-pay | Admitting: *Deleted

## 2015-02-27 ENCOUNTER — Emergency Department (HOSPITAL_COMMUNITY)
Admission: EM | Admit: 2015-02-27 | Discharge: 2015-02-27 | Disposition: A | Discharge status: Home / Self Care | Payer: Self-pay | Attending: Emergency Medicine | Admitting: Emergency Medicine

## 2015-02-27 DIAGNOSIS — Z8611 Personal history of tuberculosis: Secondary | ICD-10-CM | POA: Insufficient documentation

## 2015-02-27 DIAGNOSIS — Z791 Long term (current) use of non-steroidal anti-inflammatories (NSAID): Secondary | ICD-10-CM | POA: Insufficient documentation

## 2015-02-27 DIAGNOSIS — Z4802 Encounter for removal of sutures: Secondary | ICD-10-CM | POA: Insufficient documentation

## 2015-02-27 MED ORDER — CLINDAMYCIN HCL 150 MG PO CAPS: 150.0000 mg | ORAL_CAPSULE | Freq: Four times a day (QID) | ORAL | Status: DC

## 2015-02-27 NOTE — ED Notes (Signed)
Pt states that he had stitches placed 2 weeks ago and was told at his follow up visit last week to come back in another week.

## 2015-02-27 NOTE — ED Provider Notes (Signed)
History  This chart was scribed for non-physician practitioner, Okey Regal, PA-C,working with No att. providers found, by Marlowe Kays, ED Scribe. This patient was seen in room TR08C/TR08C and the patient's care was started at 2:56 PM.  Chief Complaint  Patient presents with  . Suture / Staple Removal   The history is provided by the patient and medical records. No language interpreter was used.    HPI Comments:  Melvin Michael is a 59 y.o. male who presents to the Emergency Department needing 7 sutures removed from the volar aspect of his left wrist that were placed 14 days ago. He states he was seen here 7 days ago but the sutures were not ready to be removed at that time. He reports he has been keeping the wound clean and covered. He reports some mild associated drainage from the area with a small amount of redness surrounding it. He denies modifying factors of the symptoms. He denies fever, chills, nausea, vomiting or SI. Tetanus vaccination was updated two weeks ago when sutures were placed.  Past Medical History  Diagnosis Date  . TB (pulmonary tuberculosis)    Past Surgical History  Procedure Laterality Date  . Appendectomy    . Hernia repair     Family History  Problem Relation Age of Onset  . Hypertension Mother   . Cancer Mother    History  Substance Use Topics  . Smoking status: Never Smoker   . Smokeless tobacco: Not on file  . Alcohol Use: Yes    Review of Systems  All other systems reviewed and are negative.   Allergies  Review of patient's allergies indicates no known allergies.  Home Medications   Prior to Admission medications   Medication Sig Start Date End Date Taking? Authorizing Provider  benzonatate (TESSALON) 100 MG capsule Take 1 capsule (100 mg total) by mouth every 8 (eight) hours. 07/11/13   Rhetta Mura Schorr, NP  chlorpheniramine-HYDROcodone (TUSSIONEX PENNKINETIC ER) 10-8 MG/5ML LQCR Take 5 mLs by mouth at bedtime as needed for  cough. 07/11/13   Rhetta Mura Schorr, NP  clindamycin (CLEOCIN) 150 MG capsule Take 1 capsule (150 mg total) by mouth every 6 (six) hours. 02/27/15   Okey Regal, PA-C  cyclobenzaprine (FLEXERIL) 5 MG tablet Take 1 tablet (5 mg total) by mouth 2 (two) times daily as needed for muscle spasms. 02/20/15   Tiffany Carlota Raspberry, PA-C  ibuprofen (ADVIL,MOTRIN) 600 MG tablet Take 1 tablet (600 mg total) by mouth every 8 (eight) hours as needed for pain. 10/31/12   Thurnell Lose, MD  ibuprofen (ADVIL,MOTRIN) 600 MG tablet Take 1 tablet (600 mg total) by mouth every 6 (six) hours as needed. 02/20/15   Tiffany Carlota Raspberry, PA-C  ibuprofen (ADVIL,MOTRIN) 800 MG tablet Take 1 tablet (800 mg total) by mouth 3 (three) times daily. 02/13/15   Dahlia Bailiff, PA-C   Triage Vitals: BP 126/85 mmHg  Pulse 64  Temp(Src) 97.8 F (36.6 C) (Oral)  Resp 16  SpO2 98%  Physical Exam  Constitutional: He is oriented to person, place, and time. He appears well-developed and well-nourished.  HENT:  Head: Normocephalic and atraumatic.  Eyes: EOM are normal.  Neck: Normal range of motion.  Cardiovascular: Normal rate.   Pulmonary/Chest: Effort normal.  Left radial pulse 2+  Musculoskeletal: Normal range of motion.  Neurological: He is alert and oriented to person, place, and time.  Sensation intact grossly to left distal extremity  Skin: Skin is warm and dry.  Linear laceration to the  volar aspect of the left wrist, wound healing with surrounding minor erythema, no discharge  Psychiatric: He has a normal mood and affect. His behavior is normal.  Nursing note and vitals reviewed.   ED Course  Procedures (including critical care time) DIAGNOSTIC STUDIES: Oxygen Saturation is 98% on RA, normal by my interpretation.   COORDINATION OF CARE: 2:59 PM- Will remove sutures. Pt verbalizes understanding and agrees to plan.  Medications - No data to display  Labs Review Labs Reviewed - No data to display  Imaging Review No  results found.   EKG Interpretation None      MDM   Final diagnoses:  Visit for suture removal     Labs:none  Imaging:none  Consults:none  Therapeutics:none  Discharge Meds: none  Assessment/Plan: Sutures removed, area mild erythema, no discharge noted. Patient reports that he did have some discharge previously. He was placed on clindamycin and given strict return precautions patient verbalizes understanding gram to today's plan and had no further questions or concerns at time of discharge. Removed sutures without complication.   I personally performed the services described in this documentation, which was scribed in my presence. The recorded information has been reviewed and is accurate.    Okey Regal, PA-C 02/27/15 1613  Evelina Bucy, MD 02/28/15 [phone]

## 2015-02-27 NOTE — ED Notes (Signed)
Declined W/C at D/C and was escorted to lobby by RN. 

## 2015-02-27 NOTE — Discharge Instructions (Signed)
°  Please monitor for new or worsening signs or symptoms, follow-up with your primary care provider if symptoms worsen or persist. Please use and buttocks as directed.

## 2016-06-04 NOTE — Congregational Nurse Program (Signed)
Congregational Nurse Program Note  Date of Encounter: 05/26/2016  Past Medical History: Past Medical History:  Diagnosis Date  . TB (pulmonary tuberculosis)     Encounter Details:     CNP Questionnaire - 05/26/16 1623      Patient Demographics   Is this a new or existing patient? New   Patient is considered a/an Not Applicable   Race Latino/Hispanic     Patient Assistance   Location of Patient Assistance Not Applicable   Patient's financial/insurance status Low Income;Self-Pay (Uninsured)   Uninsured Patient (Orange Card/Care Connects) Yes   Interventions Assisted patient in making appt.;Counseled to make appt. with provider   Patient referred to apply for the following financial assistance [place] insecurities addressed Not Applicable   Transportation assistance No   Assistance securing medications No   Educational health offerings Behavioral health;Navigating the healthcare system     Encounter Details   Primary purpose of visit Chronic Illness/Condition Visit;Navigating the Healthcare System   Was an Emergency Department visit averted? Not Applicable   Does patient have a medical provider? No   Patient referred to Clinic   Was a mental health screening completed? (GAINS tool) No   Does patient have dental issues? No   Does patient have vision issues? No   Does your patient have an abnormal blood pressure today? No   Since previous encounter, have you referred patient for abnormal blood pressure that resulted in a new diagnosis or medication change? No   Does your patient have an abnormal blood glucose today? No   Since previous encounter, have you referred patient for abnormal blood glucose that resulted in a new diagnosis or medication change? No   Was there a life-saving intervention made? No     Referred by [facility].  Client wanting assistance with finding a provider.  Referred to [facility].  He will  complete application for [employer] Assistance and orange card

## 2017-02-21 ENCOUNTER — Other Ambulatory Visit: Payer: Self-pay | Admitting: Family Medicine

## 2017-02-21 ENCOUNTER — Encounter: Payer: Self-pay | Admitting: Gastroenterology

## 2017-02-21 DIAGNOSIS — M7989 Other specified soft tissue disorders: Secondary | ICD-10-CM

## 2017-02-26 ENCOUNTER — Ambulatory Visit (HOSPITAL_COMMUNITY)
Admission: RE | Admit: 2017-02-26 | Discharge: 2017-02-26 | Disposition: A | Discharge status: Home / Self Care | Payer: Self-pay | Source: Ambulatory Visit | Attending: Family Medicine | Admitting: Family Medicine

## 2017-02-26 DIAGNOSIS — M7989 Other specified soft tissue disorders: Secondary | ICD-10-CM

## 2017-02-26 DIAGNOSIS — M799 Soft tissue disorder, unspecified: Secondary | ICD-10-CM | POA: Insufficient documentation

## 2017-02-27 ENCOUNTER — Ambulatory Visit (AMBULATORY_SURGERY_CENTER): Payer: Self-pay | Admitting: *Deleted

## 2017-02-27 VITALS — Ht 70.0 in | Wt 172.2 lb

## 2017-02-27 DIAGNOSIS — Z1211 Encounter for screening for malignant neoplasm of colon: Secondary | ICD-10-CM

## 2017-02-27 MED ORDER — NA SULFATE-K SULFATE-MG SULF 17.5-3.13-1.6 GM/177ML PO SOLN: 1.0000 | Freq: Once | ORAL | 0 refills | Status: AC

## 2017-02-27 NOTE — Progress Notes (Signed)
Denies allergies to eggs or soy products. Denies complications with sedation or anesthesia. Denies O2 use. Denies use of diet or weight loss medications.  Emmi instructions not given for colonoscopy, pt does not have access to email or the Internet

## 2017-03-01 ENCOUNTER — Telehealth: Payer: Self-pay

## 2017-03-01 NOTE — Telephone Encounter (Signed)
Spoke with pt regarding the Suprep bowel prep is too expensive. Pt will come by our office today to pick up a discount coupon ($50.00 coupon) to take to the pharmacist. He will call back if he has any other questions.

## 2017-03-09 ENCOUNTER — Ambulatory Visit (AMBULATORY_SURGERY_CENTER): Payer: Self-pay | Admitting: Gastroenterology

## 2017-03-09 ENCOUNTER — Encounter: Payer: Self-pay | Admitting: Gastroenterology

## 2017-03-09 VITALS — BP 111/81 | HR 52 | Temp 98.6°F | Resp 11 | Ht 70.0 in | Wt 172.0 lb

## 2017-03-09 DIAGNOSIS — D128 Benign neoplasm of rectum: Secondary | ICD-10-CM

## 2017-03-09 DIAGNOSIS — Z1212 Encounter for screening for malignant neoplasm of rectum: Secondary | ICD-10-CM

## 2017-03-09 DIAGNOSIS — Z1211 Encounter for screening for malignant neoplasm of colon: Secondary | ICD-10-CM

## 2017-03-09 MED ORDER — SODIUM CHLORIDE 0.9 % IV SOLN: 500.0000 mL | INTRAVENOUS | Status: AC

## 2017-03-09 NOTE — Progress Notes (Signed)
Called to room to assist during endoscopic procedure.  Patient ID and intended procedure confirmed with present staff. Received instructions for my participation in the procedure from the performing physician.  

## 2017-03-09 NOTE — Patient Instructions (Signed)
YOU HAD AN ENDOSCOPIC PROCEDURE TODAY AT THE  ENDOSCOPY CENTER:   Refer to the procedure report that was given to you for any specific questions about what was found during the examination.  If the procedure report does not answer your questions, please call your gastroenterologist to clarify.  If you requested that your care partner not be given the details of your procedure findings, then the procedure report has been included in a sealed envelope for you to review at your convenience later.  YOU SHOULD EXPECT: Some feelings of bloating in the abdomen. Passage of more gas than usual.  Walking can help get rid of the air that was put into your GI tract during the procedure and reduce the bloating. If you had a lower endoscopy (such as a colonoscopy or flexible sigmoidoscopy) you may notice spotting of blood in your stool or on the toilet paper. If you underwent a bowel prep for your procedure, you may not have a normal bowel movement for a few days.  Please Note:  You might notice some irritation and congestion in your nose or some drainage.  This is from the oxygen used during your procedure.  There is no need for concern and it should clear up in a day or so.  SYMPTOMS TO REPORT IMMEDIATELY:   Following lower endoscopy (colonoscopy or flexible sigmoidoscopy):  Excessive amounts of blood in the stool  Significant tenderness or worsening of abdominal pains  Swelling of the abdomen that is new, acute  Fever of 100F or higher  For urgent or emergent issues, a gastroenterologist can be reached at any hour by calling [phone].   DIET:  We do recommend a small meal at first, but then you may proceed to your regular diet.  Drink plenty of fluids but you should avoid alcoholic beverages for 24 hours.  MEDICATIONS: Continue present medications.  Please see handouts given to you by your recovery nurse.  ACTIVITY:  You should plan to take it easy for the rest of today and you should NOT  DRIVE or use heavy machinery until tomorrow (because of the sedation medicines used during the test).    FOLLOW UP: Our staff will call the number listed on your records the next business day following your procedure to check on you and address any questions or concerns that you may have regarding the information given to you following your procedure. If we do not reach you, we will leave a message.  However, if you are feeling well and you are not experiencing any problems, there is no need to return our call.  We will assume that you have returned to your regular daily activities without incident.  If any biopsies were taken you will be contacted by phone or by letter within the next 1-3 weeks.  Please call us at [phone] if you have not heard about the biopsies in 3 weeks.   Thank you for allowing us to provide for your healthcare needs today.   SIGNATURES/CONFIDENTIALITY: You and/or your care partner have signed paperwork which will be entered into your electronic medical record.  These signatures attest to the fact that that the information above on your After Visit Summary has been reviewed and is understood.  Full responsibility of the confidentiality of this discharge information lies with you and/or your care-partner. 

## 2017-03-09 NOTE — Op Note (Signed)
[name] Procedure Date: 03/09/2017 11:16 AM MRN: [id number] Endoscopist: Mauri Pole , MD Age: 61 Referring MD:  Date of Birth: Mar 08, 1956 Gender: Male Account #: [id number] Procedure:                Colonoscopy Indications:              Screening for colorectal malignant neoplasm, This                            is the patient's first colonoscopy Medicines:                Monitored Anesthesia Care Procedure:                Pre-Anesthesia Assessment:                           - Prior to the procedure, a History and Physical                            was performed, and patient medications and                            allergies were reviewed. The patient's tolerance of                            previous anesthesia was also reviewed. The risks                            and benefits of the procedure and the sedation                            options and risks were discussed with the patient.                            All questions were answered, and informed consent                            was obtained. Prior Anticoagulants: The patient has                            taken no previous anticoagulant or antiplatelet                            agents. ASA Grade Assessment: II - A patient with                            mild systemic disease. After reviewing the risks                            and benefits, the patient was deemed in                            satisfactory condition to undergo the procedure.  After obtaining informed consent, the colonoscope                            was passed under direct vision. Throughout the                            procedure, the patient's blood pressure, pulse, and                            oxygen saturations were monitored continuously. The                            Colonoscope was introduced through the anus and                            advanced to the the  ileocolonic anastomosis. The                            colonoscopy was performed without difficulty. The                            patient tolerated the procedure well. The quality                            of the bowel preparation was excellent. Ileo                            colonic anastomosis, ileum and the rectum were                            photographed. Scope In: 11:17:59 AM Scope Out: 11:32:19 AM Scope Withdrawal Time: 0 hours 10 minutes 58 seconds  Total Procedure Duration: 0 hours 14 minutes 20 seconds  Findings:                 The perianal and digital rectal examinations were                            normal.                           There was evidence of a prior side-to-side                            ileo-colonic anastomosis in the ascending colon.                            This was patent and was characterized by healthy                            appearing mucosa.                           Multiple small and large-mouthed diverticula were  found in the sigmoid colon, descending colon,                            transverse colon and ascending colon.                           A 4 mm polyp was found in the rectum. The polyp was                            sessile. The polyp was removed with a cold snare.                            Resection and retrieval were complete.                           The exam was otherwise without abnormality. Complications:            No immediate complications. Estimated Blood Loss:     Estimated blood loss was minimal. Impression:               - Patent side-to-side ileo-colonic anastomosis,                            characterized by healthy appearing mucosa.                           - Diverticulosis in the sigmoid colon, in the                            descending colon, in the transverse colon and in                            the ascending colon.                           - One 4 mm polyp in the rectum,  removed with a cold                            snare. Resected and retrieved.                           - The examination was otherwise normal. Recommendation:           - Patient has a contact number available for                            emergencies. The signs and symptoms of potential                            delayed complications were discussed with the                            patient. Return to normal activities tomorrow.  Written discharge instructions were provided to the                            patient.                           - Resume previous diet.                           - Continue present medications.                           - Await pathology results.                           - Repeat colonoscopy in 5-10 years for surveillance                            based on pathology results. Mauri Pole, MD 03/09/2017 11:36:48 AM This report has been signed electronically.

## 2017-03-09 NOTE — Progress Notes (Signed)
To recovery, report to RN, VSS. 

## 2017-03-12 ENCOUNTER — Telehealth: Payer: Self-pay

## 2017-03-12 NOTE — Telephone Encounter (Signed)
  Follow up Call-  Call back number 03/09/2017  Post procedure Call Back phone  # [phone]  Permission to leave phone message Yes  Some recent data might be hidden     Patient questions:  Do you have a fever, pain , or abdominal swelling? No. Pain Score  0 *  Have you tolerated food without any problems? Yes.    Have you been able to return to your normal activities? Yes.    Do you have any questions about your discharge instructions: Diet   No. Medications  No. Follow up visit  No.  Do you have questions or concerns about your Care? No.  Actions: * If pain score is 4 or above: No action needed, pain <4.

## 2017-03-13 ENCOUNTER — Other Ambulatory Visit (HOSPITAL_COMMUNITY): Payer: Self-pay | Admitting: Family Medicine

## 2017-03-13 ENCOUNTER — Other Ambulatory Visit: Payer: Self-pay | Admitting: Family Medicine

## 2017-03-13 DIAGNOSIS — R229 Localized swelling, mass and lump, unspecified: Secondary | ICD-10-CM

## 2017-03-16 ENCOUNTER — Ambulatory Visit (HOSPITAL_COMMUNITY)
Admission: RE | Admit: 2017-03-16 | Discharge: 2017-03-16 | Disposition: A | Discharge status: Home / Self Care | Payer: Self-pay | Source: Ambulatory Visit | Attending: Family Medicine | Admitting: Family Medicine

## 2017-03-16 DIAGNOSIS — R229 Localized swelling, mass and lump, unspecified: Secondary | ICD-10-CM | POA: Insufficient documentation

## 2017-03-19 ENCOUNTER — Encounter: Payer: Self-pay | Admitting: Gastroenterology

## 2017-05-28 ENCOUNTER — Encounter (HOSPITAL_COMMUNITY): Payer: Self-pay | Admitting: Emergency Medicine

## 2017-05-28 ENCOUNTER — Ambulatory Visit (INDEPENDENT_AMBULATORY_CARE_PROVIDER_SITE_OTHER): Payer: Self-pay

## 2017-05-28 ENCOUNTER — Ambulatory Visit (HOSPITAL_COMMUNITY)
Admission: EM | Admit: 2017-05-28 | Discharge: 2017-05-28 | Disposition: A | Discharge status: Home / Self Care | Payer: Self-pay | Attending: Emergency Medicine | Admitting: Emergency Medicine

## 2017-05-28 DIAGNOSIS — S300XXA Contusion of lower back and pelvis, initial encounter: Secondary | ICD-10-CM

## 2017-05-28 DIAGNOSIS — M545 Low back pain: Secondary | ICD-10-CM

## 2017-05-28 DIAGNOSIS — S20229A Contusion of unspecified back wall of thorax, initial encounter: Secondary | ICD-10-CM

## 2017-05-28 DIAGNOSIS — W19XXXA Unspecified fall, initial encounter: Secondary | ICD-10-CM

## 2017-05-28 MED ORDER — HYDROCODONE-ACETAMINOPHEN 5-325 MG PO TABS: 1.0000 | ORAL_TABLET | ORAL | 0 refills | Status: DC | PRN

## 2017-05-28 MED ORDER — IBUPROFEN 600 MG PO TABS: 600.0000 mg | ORAL_TABLET | Freq: Four times a day (QID) | ORAL | 0 refills | Status: DC | PRN

## 2017-05-28 NOTE — ED Provider Notes (Signed)
[facility]    CSN: [id number] Arrival date & time: 05/28/17  1424     History   Chief Complaint Chief Complaint  Patient presents with  . Back Pain  . Fall    HPI Keene Gilkey is a 61 y.o. male.   61 year-old male, with history of arthritis, presenting today complaining of back pain. Patient states that he stumbled and fell backwards yesterday. Landed flat on his back on a concrete surface. Since that time, he has lumbar and thoracic back pain. Complaining of increased back pain with movement. Denies hitting his head or LOC due to the fall. Denies radiation of pain into the extremities  No loss of bowel or bladder function, saddle anesthesia, numbness/weakness in the lower extremities    The history is provided by the patient.  Back Pain  Location:  Thoracic spine and lumbar spine Quality:  Aching Radiates to:  Does not radiate Pain severity:  Moderate Pain is:  Same all the time Onset quality:  Gradual Duration:  1 day Timing:  Constant Progression:  Unchanged Chronicity:  New Context: falling   Context: not emotional stress, not jumping from heights, not lifting heavy objects, not MCA, not MVA, not occupational injury and not pedestrian accident   Relieved by:  Nothing Worsened by:  Ambulation, twisting and bending Ineffective treatments:  None tried Associated symptoms: no abdominal pain, no abdominal swelling, no bladder incontinence, no bowel incontinence, no chest pain, no dysuria, no fever, no leg pain, no numbness, no paresthesias, no pelvic pain, no perianal numbness, no tingling, no weakness and no weight loss   Risk factors: no hx of cancer, no hx of osteoporosis and no lack of exercise     Past Medical History:  Diagnosis Date  . Arthritis   . TB (pulmonary tuberculosis)     Patient Active Problem List   Diagnosis Date Noted  . TB (pulmonary tuberculosis)     Past Surgical History:  Procedure Laterality Date  . APPENDECTOMY    .  HERNIA REPAIR         Home Medications    Prior to Admission medications   Medication Sig Start Date End Date Taking? Authorizing Provider  acetaminophen (TYLENOL) 650 MG CR tablet Take 650 mg by mouth every 8 (eight) hours as needed for pain.    [provider]  aspirin EC 81 MG tablet Take 81 mg by mouth daily.    [provider]  cyclobenzaprine (FLEXERIL) 5 MG tablet Take 1 tablet (5 mg total) by mouth 2 (two) times daily as needed for muscle spasms. 02/20/15   Delos Haring, PA-C  HYDROcodone-acetaminophen (NORCO/VICODIN) 5-325 MG tablet Take 1 tablet by mouth every 4 (four) hours as needed. 05/28/17   Vernida Mcnicholas C, PA-C  ibuprofen (ADVIL,MOTRIN) 600 MG tablet Take 1 tablet (600 mg total) by mouth every 6 (six) hours as needed. 05/28/17   Jaslin Novitski C, PA-C  Multiple Vitamin (MULTIVITAMIN) tablet Take 1 tablet by mouth daily.    [provider]    Family History Family History  Problem Relation Age of Onset  . Hypertension Mother   . Cancer Mother   . Colon cancer Neg Hx   . Esophageal cancer Neg Hx   . Rectal cancer Neg Hx   . Stomach cancer Neg Hx     Social History Social History  Substance Use Topics  . Smoking status: Former Smoker    Quit date: 07/31/2001  . Smokeless tobacco: Never Used  .  Alcohol use Yes     Comment: occas     Allergies   Patient has no known allergies.   Review of Systems Review of Systems  Constitutional: Negative for chills, fever and weight loss.  HENT: Negative for ear pain and sore throat.   Eyes: Negative for pain and visual disturbance.  Respiratory: Negative for cough and shortness of breath.   Cardiovascular: Negative for chest pain and palpitations.  Gastrointestinal: Negative for abdominal pain, bowel incontinence and vomiting.  Genitourinary: Negative for bladder incontinence, dysuria, hematuria and pelvic pain.  Musculoskeletal: Positive for back pain. Negative for arthralgias.  Skin:  Negative for color change and rash.  Neurological: Negative for tingling, seizures, syncope, weakness, numbness and paresthesias.  All other systems reviewed and are negative.    Physical Exam Triage Vital Signs ED Triage Vitals  Enc Vitals Group     BP 05/28/17 1452 126/80     Pulse Rate 05/28/17 1452 83     Resp 05/28/17 1452 14     Temp 05/28/17 1452 98.4 F (36.9 C)     Temp src --      SpO2 05/28/17 1452 95 %     Weight --      Height --      Head Circumference --      Peak Flow --      Pain Score 05/28/17 1453 6     Pain Loc --      Pain Edu? --      Excl. in [place]? --    No data found.   Updated Vital Signs BP 126/80   Pulse 83   Temp 98.4 F (36.9 C)   Resp 14   SpO2 95%   Visual Acuity Right Eye Distance:   Left Eye Distance:   Bilateral Distance:    Right Eye Near:   Left Eye Near:    Bilateral Near:     Physical Exam  Constitutional: He appears well-developed and well-nourished.  HENT:  Head: Normocephalic and atraumatic.  Eyes: Conjunctivae are normal.  Neck: Neck supple.  Cardiovascular: Normal rate and regular rhythm.   No murmur heard. Pulmonary/Chest: Effort normal and breath sounds normal. No respiratory distress.  Abdominal: Soft. There is no tenderness.  Musculoskeletal: He exhibits no edema.       Thoracic back: He exhibits tenderness.       Lumbar back: He exhibits tenderness.  Mild midline tenderness to palpation of the thoracic and lumbar spine. Normal strength and sensation of all extremities    Neurological: He is alert. He has normal strength and normal reflexes. No cranial nerve deficit or sensory deficit. He displays a negative Romberg sign. GCS eye subscore is 4. GCS verbal subscore is 5. GCS motor subscore is 6.  Skin: Skin is warm and dry.  Psychiatric: He has a normal mood and affect.  Nursing note and vitals reviewed.    UC Treatments / Results  Labs (all labs ordered are listed, but only abnormal results are  displayed) Labs Reviewed - No data to display  EKG  EKG Interpretation None       Radiology Dg Thoracic Spine 2 View  Result Date: 05/28/2017 CLINICAL DATA:  Fall, back pain EXAM: THORACIC SPINE 2 VIEWS COMPARISON:  Chest radiographs dated 06/30/2005 FINDINGS: Normal thoracic kyphosis. No evidence of fracture or dislocation. Vertebral body heights and intervertebral disc spaces are maintained. Mild degenerative changes of the lower thoracic spine. Visualized lung apices are clear. IMPRESSION: Negative. Electronically Signed  By: Julian Hy M.D.   On: 05/28/2017 15:43   Dg Lumbar Spine Complete  Result Date: 05/28/2017 CLINICAL DATA:  Fall, back pain EXAM: LUMBAR SPINE - COMPLETE 4+ VIEW COMPARISON:  None. FINDINGS: Five lumbar-type vertebral bodies. Normal lumbar lordosis. No evidence of fracture or dislocation. Vertebral body heights are maintained. Mild degenerative changes, most prominent at L4-5. Visualized bony pelvis appears intact. IMPRESSION: No fracture or dislocation is seen. Mild degenerative changes of the lower lumbar spine. Electronically Signed   By: Julian Hy M.D.   On: 05/28/2017 15:43    Procedures Procedures (including critical care time)  Medications Ordered in UC Medications - No data to display   Initial Impression / Assessment and Plan / UC Course  I have reviewed the triage vital signs and the nursing notes.  Pertinent labs & imaging results that were available during my care of the patient were reviewed by me and considered in my medical decision making (see chart for details).     Fall yesterday - thoracic and lumbar back pain - XR shows no acute findings or vertebral fracture. Symptoms likely secondary to contusion. Will treat symptomatically for pain.   Final Clinical Impressions(s) / UC Diagnoses   Final diagnoses:  Fall, initial encounter  Lumbar contusion, initial encounter  Contusion of back, unspecified laterality, initial  encounter    New Prescriptions New Prescriptions   HYDROCODONE-ACETAMINOPHEN (NORCO/VICODIN) 5-325 MG TABLET    Take 1 tablet by mouth every 4 (four) hours as needed.   IBUPROFEN (ADVIL,MOTRIN) 600 MG TABLET    Take 1 tablet (600 mg total) by mouth every 6 (six) hours as needed.     Controlled Substance Prescriptions [place] Controlled Substance Registry consulted? No   Phebe Colla, [place] 05/28/17 1554

## 2017-05-28 NOTE — ED Triage Notes (Signed)
Pt states he fell yesterday and landed on his back. Pt ambulatory. C/o back pain.

## 2017-06-18 ENCOUNTER — Ambulatory Visit (HOSPITAL_COMMUNITY)
Admission: EM | Admit: 2017-06-18 | Discharge: 2017-06-18 | Disposition: A | Discharge status: Other Acute Inpatient Hospital | Payer: Self-pay

## 2017-10-31 ENCOUNTER — Ambulatory Visit (HOSPITAL_COMMUNITY)
Admission: EM | Admit: 2017-10-31 | Discharge: 2017-10-31 | Disposition: A | Discharge status: Home / Self Care | Payer: Self-pay | Attending: Family Medicine | Admitting: Family Medicine

## 2017-10-31 ENCOUNTER — Encounter (HOSPITAL_COMMUNITY): Payer: Self-pay | Admitting: Emergency Medicine

## 2017-10-31 DIAGNOSIS — S161XXA Strain of muscle, fascia and tendon at neck level, initial encounter: Secondary | ICD-10-CM

## 2017-10-31 DIAGNOSIS — M25512 Pain in left shoulder: Secondary | ICD-10-CM

## 2017-10-31 DIAGNOSIS — M25511 Pain in right shoulder: Secondary | ICD-10-CM

## 2017-10-31 MED ORDER — IBUPROFEN 800 MG PO TABS: 800.0000 mg | ORAL_TABLET | Freq: Three times a day (TID) | ORAL | 0 refills | Status: DC

## 2017-10-31 MED ORDER — KETOROLAC TROMETHAMINE 60 MG/2ML IM SOLN
INTRAMUSCULAR | Status: AC
  Filled 2017-10-31: qty 2

## 2017-10-31 MED ORDER — KETOROLAC TROMETHAMINE 60 MG/2ML IM SOLN
60.0000 mg | Freq: Once | INTRAMUSCULAR | Status: AC
  Administered 2017-10-31: 60 mg via INTRAMUSCULAR

## 2017-10-31 MED ORDER — CYCLOBENZAPRINE HCL 10 MG PO TABS: 10.0000 mg | ORAL_TABLET | Freq: Two times a day (BID) | ORAL | 0 refills | Status: DC | PRN

## 2017-10-31 NOTE — Discharge Instructions (Addendum)
Use anti-inflammatories for pain/swelling. You may take up to 800 mg Ibuprofen every 8 hours with food. You may supplement Ibuprofen with Tylenol [phone] mg every 8 hours.   Please use Flexeril every 12 hours as needed to help relax your muscles.  Please use only when you can be at home or at bedtime as this will cause sedation.  I expect symptoms to slowly resolve over the next 2 weeks.  Please return if symptoms not improving as expected, return if symptoms changing or worsening.
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
## 2018-01-12 ENCOUNTER — Ambulatory Visit (HOSPITAL_COMMUNITY)
Admission: EM | Admit: 2018-01-12 | Discharge: 2018-01-12 | Disposition: A | Discharge status: Home / Self Care | Payer: Worker's Compensation | Attending: Family Medicine | Admitting: Family Medicine

## 2018-01-12 ENCOUNTER — Encounter (HOSPITAL_COMMUNITY): Payer: Self-pay | Admitting: *Deleted

## 2018-01-12 DIAGNOSIS — M5441 Lumbago with sciatica, right side: Secondary | ICD-10-CM | POA: Diagnosis not present

## 2018-01-12 MED ORDER — PREDNISONE 50 MG PO TABS: 50.0000 mg | ORAL_TABLET | Freq: Every day | ORAL | 0 refills | Status: AC

## 2018-01-12 MED ORDER — CYCLOBENZAPRINE HCL 10 MG PO TABS: 10.0000 mg | ORAL_TABLET | Freq: Two times a day (BID) | ORAL | 0 refills | Status: DC | PRN

## 2018-01-12 MED ORDER — IBUPROFEN 800 MG PO TABS: 800.0000 mg | ORAL_TABLET | Freq: Three times a day (TID) | ORAL | 0 refills | Status: DC

## 2018-01-12 NOTE — ED Triage Notes (Signed)
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  Denies any parasthesias.

## 2018-01-12 NOTE — ED Provider Notes (Signed)
Melvin Michael    CSN: [id number] Arrival date & time: 01/12/18  1606     History   Chief Complaint Chief Complaint  Patient presents with  . Back Pain    HPI Melvin Michael is a 62 y.o. male nocturia past medical history presenting today for evaluation of right-sided lower back pain.  Patient states that he has had this back pain for the past 1 to 2 months.  Is been progressively worsening.  Worsens with certain movements.  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  States that the pain will radiate into his right leg.  Denies numbness or tingling.  Denies saddle anesthesia or loss of bowel or bladder control.  Has been taking Tylenol 650 mg for arthritis, but this is not helping.  Denies any new injury that has recently worsened this or any specific injury.  HPI  Past Medical History:  Diagnosis Date  . Arthritis   . TB (pulmonary tuberculosis)     Patient Active Problem List   Diagnosis Date Noted  . TB (pulmonary tuberculosis)     Past Surgical History:  Procedure Laterality Date  . APPENDECTOMY    . HERNIA REPAIR         Home Medications    Prior to Admission medications   Medication Sig Start Date End Date Taking? Authorizing Provider  aspirin EC 81 MG tablet Take 81 mg by mouth daily.   Yes [provider]  Multiple Vitamin (MULTIVITAMIN) tablet Take 1 tablet by mouth daily.   Yes [provider]  cyclobenzaprine (FLEXERIL) 10 MG tablet Take 1 tablet (10 mg total) by mouth 2 (two) times daily as needed for muscle spasms. 01/12/18   Wieters, Hallie C, PA-C  ibuprofen (ADVIL,MOTRIN) 800 MG tablet Take 1 tablet (800 mg total) by mouth 3 (three) times daily. 01/12/18   Wieters, Hallie C, PA-C  predniSONE (DELTASONE) 50 MG tablet Take 1 tablet (50 mg total) by mouth daily for 5 days. 01/12/18 01/17/18  Wieters, Elesa Hacker, PA-C    Family History Family  History  Problem Relation Age of Onset  . Hypertension Mother   . Cancer Mother   . Colon cancer Neg Hx   . Esophageal cancer Neg Hx   . Rectal cancer Neg Hx   . Stomach cancer Neg Hx     Social History Social History   Tobacco Use  . Smoking status: Former Smoker    Last attempt to quit: 07/31/2001    Years since quitting: 16.4  . Smokeless tobacco: Former Network engineer Use Topics  . Alcohol use: Yes    Alcohol/week: 7.2 oz    Types: 12 Cans of beer per week  . Drug use: Not Currently    Types: Cocaine, Marijuana    Comment: "many years ago"     Allergies   Patient has no known allergies.   Review of Systems Review of Systems  Constitutional: Negative for activity change, appetite change and fever.  Eyes: Negative for visual disturbance.  Respiratory: Negative for shortness of breath.   Cardiovascular: Negative for chest pain.  Gastrointestinal: Negative for abdominal pain, nausea and vomiting.  Genitourinary: Negative for decreased urine volume and difficulty urinating.  Musculoskeletal: Positive for back pain, gait problem and myalgias. Negative for arthralgias, neck pain and neck stiffness.  Skin: Negative for wound.  Neurological: Negative for dizziness, syncope, speech difficulty, weakness, light-headedness,  numbness and headaches.     Physical Exam Triage Vital Signs ED Triage Vitals  Enc Vitals Group     BP 01/12/18 1658 137/67     Pulse Rate 01/12/18 1658 68     Resp 01/12/18 1658 16     Temp 01/12/18 1658 98.9 F (37.2 C)     Temp src --      SpO2 01/12/18 1658 96 %     Weight --      Height --      Head Circumference --      Peak Flow --      Pain Score 01/12/18 1659 5     Pain Loc --      Pain Edu? --      Excl. in [place]? --    No data found.  Updated Vital Signs BP 137/67   Pulse 68   Temp 98.9 F (37.2 C)   Resp 16   SpO2 96%   Visual Acuity Right Eye Distance:   Left Eye Distance:   Bilateral Distance:    Right Eye Near:     Left Eye Near:    Bilateral Near:     Physical Exam  Constitutional: He appears well-developed and well-nourished.  HENT:  Head: Normocephalic and atraumatic.  Eyes: Conjunctivae are normal.  Neck: Neck supple.  Cardiovascular: Normal rate and regular rhythm.  No murmur heard. Pulmonary/Chest: Effort normal and breath sounds normal. No respiratory distress.  Abdominal: Soft. There is no tenderness.  Musculoskeletal: He exhibits tenderness. He exhibits no edema.  Nontender to palpation of entire cervical, thoracic and lumbar spine.  Patient has mild tenderness to palpation overlying right lower lumbar musculature extending into the gluteal area.  Patient with positive straight leg raise on right side.  Neurological: He is alert.  Skin: Skin is warm and dry.  Psychiatric: He has a normal mood and affect.  Nursing note and vitals reviewed.    UC Treatments / Results  Labs (all labs ordered are listed, but only abnormal results are displayed) Labs Reviewed - No data to display  EKG None  Radiology No results found.  Procedures Procedures (including critical care time)  Medications Ordered in UC Medications - No data to display  Initial Impression / Assessment and Plan / UC Course  I have reviewed the triage vital signs and the nursing notes.  Pertinent labs & imaging results that were available during my care of the patient were reviewed by me and considered in my medical decision making (see chart for details).     Patient with right-sided back pain, radicular distribution.  No injury, will defer imaging.  Will recommend anti-inflammatories, Flexeril, prednisone for 5 days.  No red flags.Discussed strict return precautions. Patient verbalized understanding and is agreeable with plan.  Final Clinical Impressions(s) / UC Diagnoses   Final diagnoses:  Acute right-sided low back pain with right-sided sciatica     Discharge Instructions     Prednisone daily for 5  days with food  Use anti-inflammatories for pain/swelling. You may take up to 800 mg Ibuprofen every 8 hours with food. You may supplement Ibuprofen with Tylenol [phone] mg every 8 hours.   You may use flexeril as needed to help with pain. This is a muscle relaxer and causes sedation- please use only at bedtime or when you will be home and not have to drive/work  Please return if developing worsening pain, not improving with above, numbness, tingling, loss of bowel/bladder control; changing symptoms  ED Prescriptions    Medication Sig Dispense Auth. Provider   predniSONE (DELTASONE) 50 MG tablet Take 1 tablet (50 mg total) by mouth daily for 5 days. 5 tablet Wieters, Hallie C, PA-C   ibuprofen (ADVIL,MOTRIN) 800 MG tablet Take 1 tablet (800 mg total) by mouth 3 (three) times daily. 30 tablet Wieters, Hallie C, PA-C   cyclobenzaprine (FLEXERIL) 10 MG tablet Take 1 tablet (10 mg total) by mouth 2 (two) times daily as needed for muscle spasms. 20 tablet Wieters, Burchinal C, PA-C     Controlled Substance Prescriptions [place] Controlled Substance Registry consulted? Not Applicable   Janith Lima, [place] 01/12/18 2156

## 2018-01-12 NOTE — Discharge Instructions (Addendum)
Prednisone daily for 5 days with food  Use anti-inflammatories for pain/swelling. You may take up to 800 mg Ibuprofen every 8 hours with food. You may supplement Ibuprofen with Tylenol [id number] mg every 8 hours.   You may use flexeril as needed to help with pain. This is a muscle relaxer and causes sedation- please use only at bedtime or when you will be home and not have to drive/work  Please return if developing worsening pain, not improving with above, numbness, tingling, loss of bowel/bladder control; changing symptoms

## 2018-10-25 IMAGING — US US SOFT TISSUE HEAD/NECK
1 series · 7 of 7 positions shown · non-contrast
Comparison: None.

CLINICAL DATA: 61-year-old male with a history of palpable nodule
for 5 years

EXAM:
ULTRASOUND OF HEAD/NECK SOFT TISSUES
TECHNIQUE: Ultrasound examination of the head and neck soft tissues was
performed in the area of clinical concern.

[Series 1: us soft tissue head/neck · 0.06mm/px · 7 of 7 slices shown]
[im 1/7]
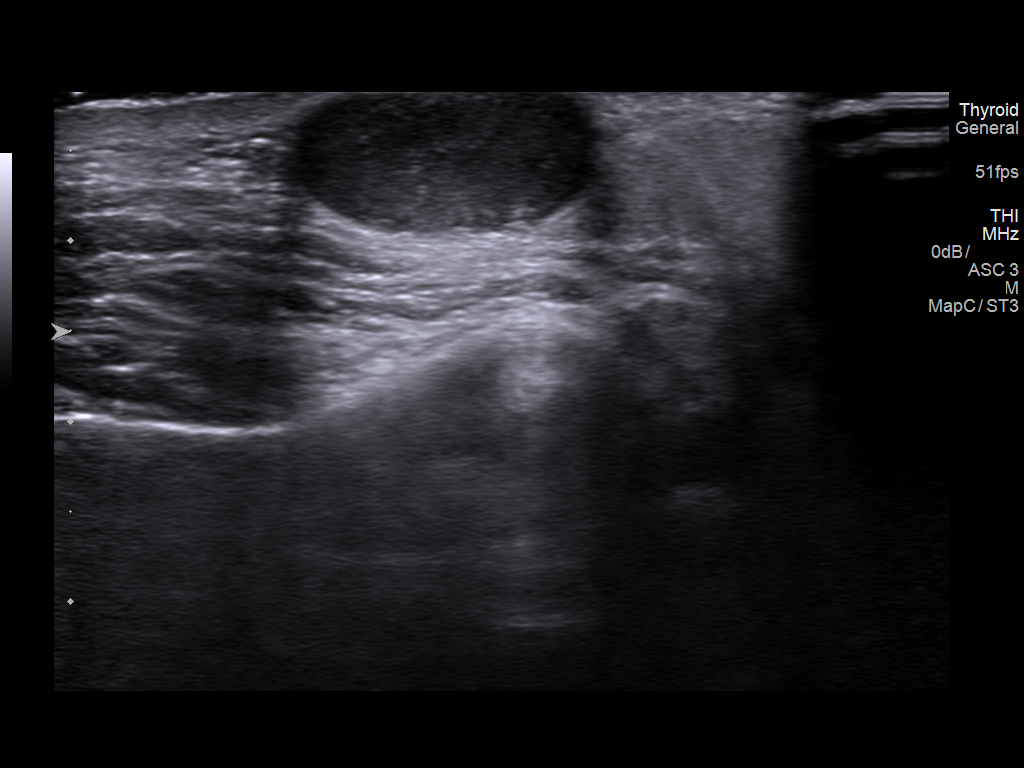
[im 2/7]
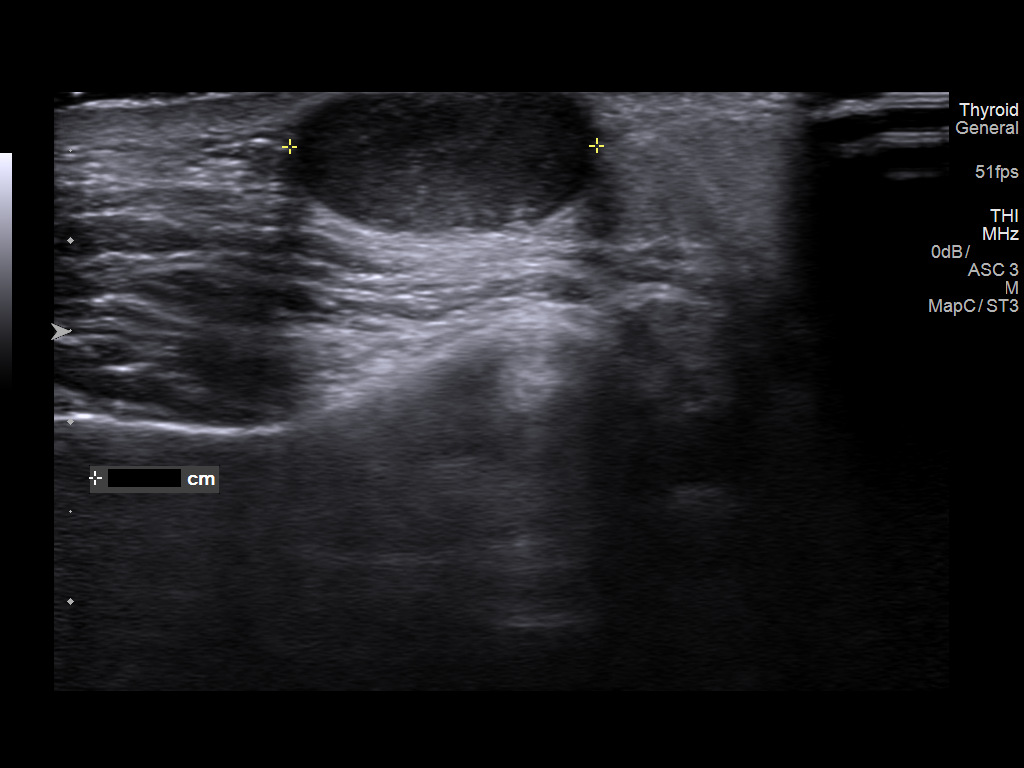
[im 3/7]
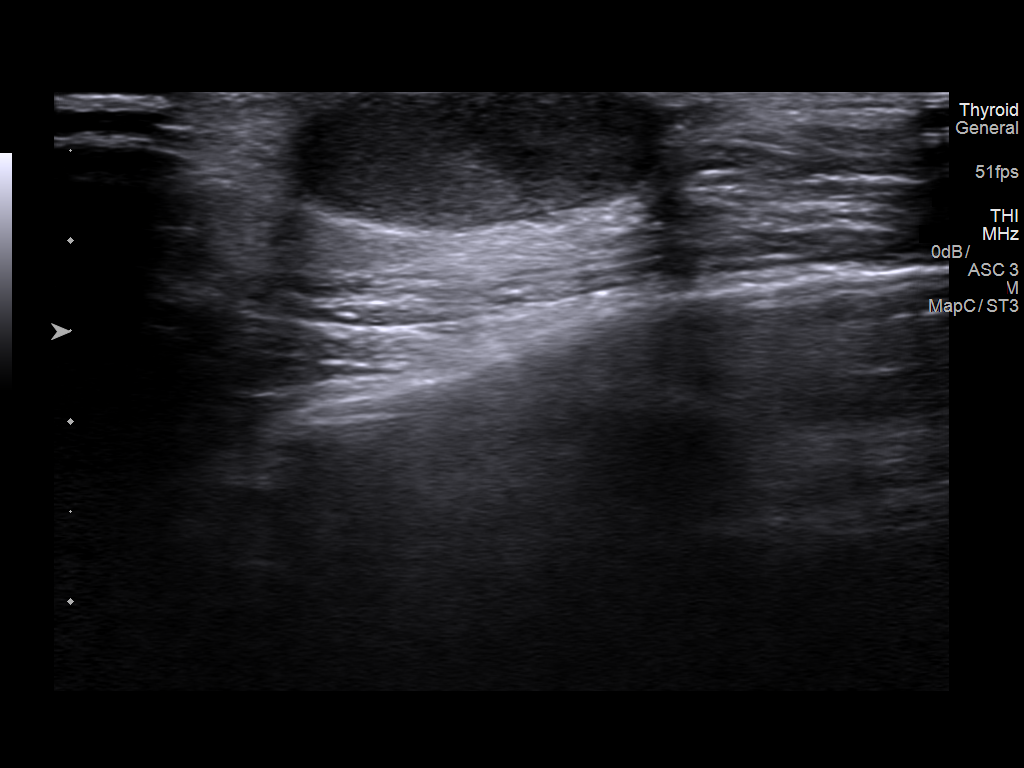
[im 4/7]
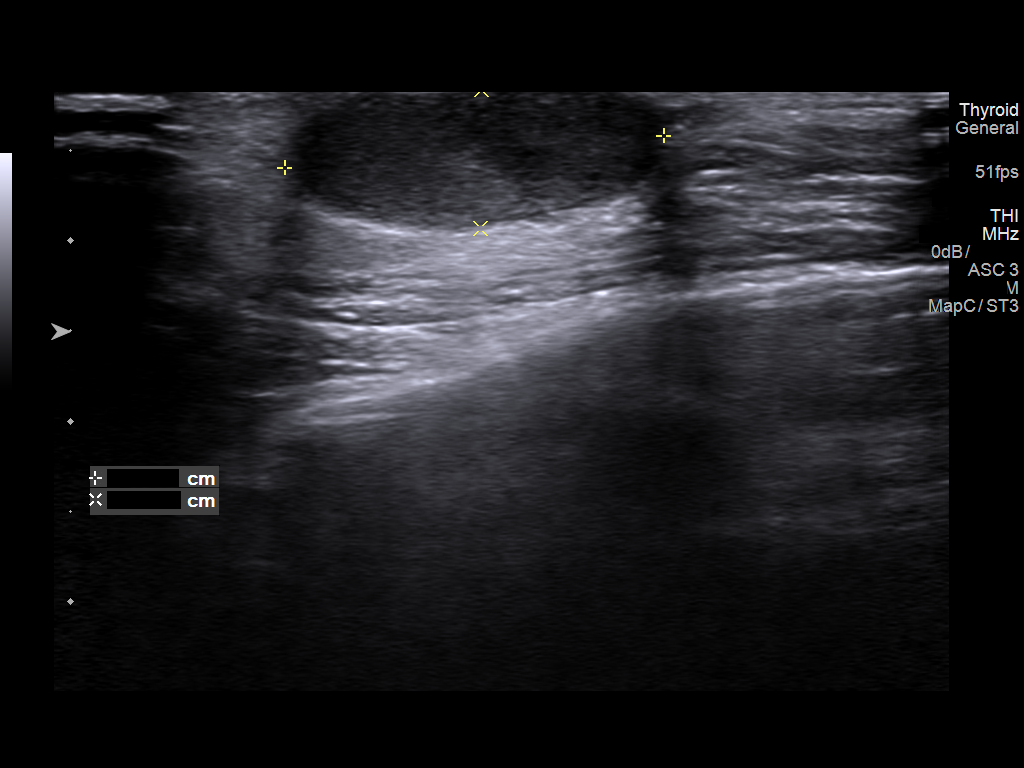
[im 5/7]
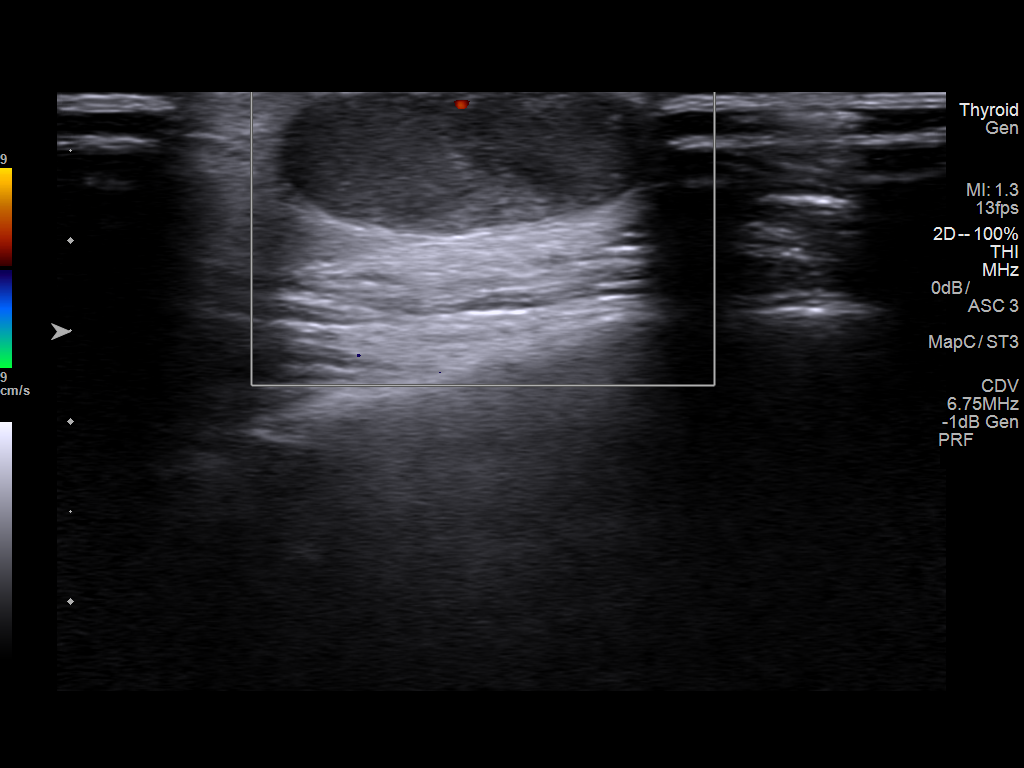
[im 6/7]
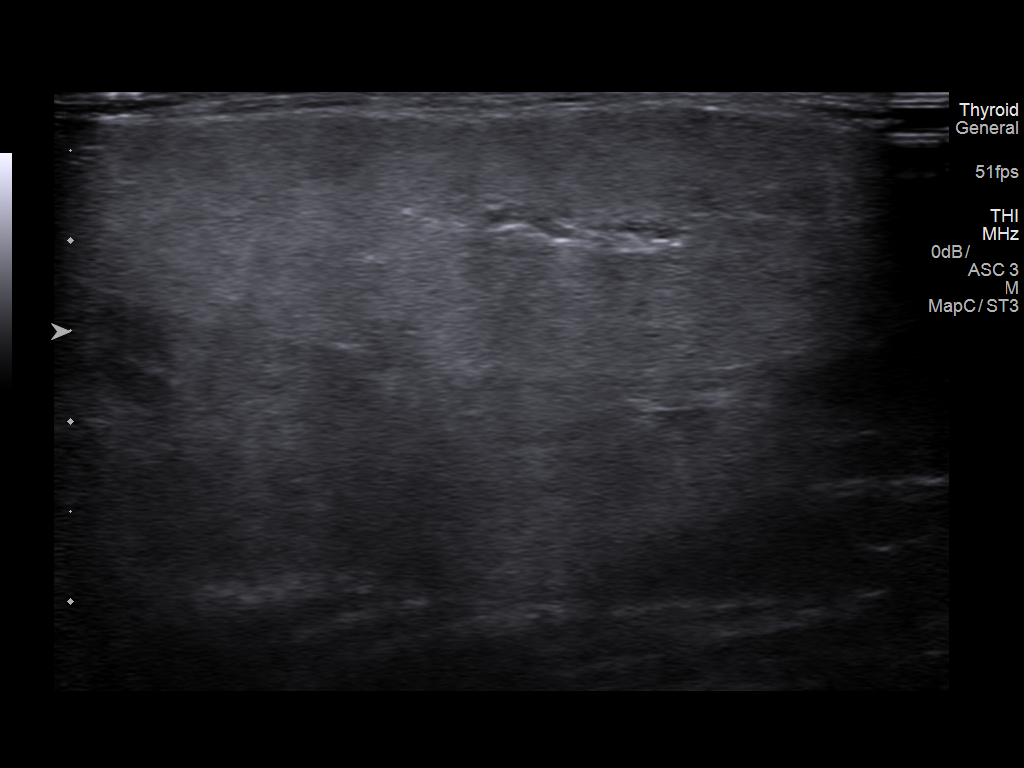
[im 7/7]
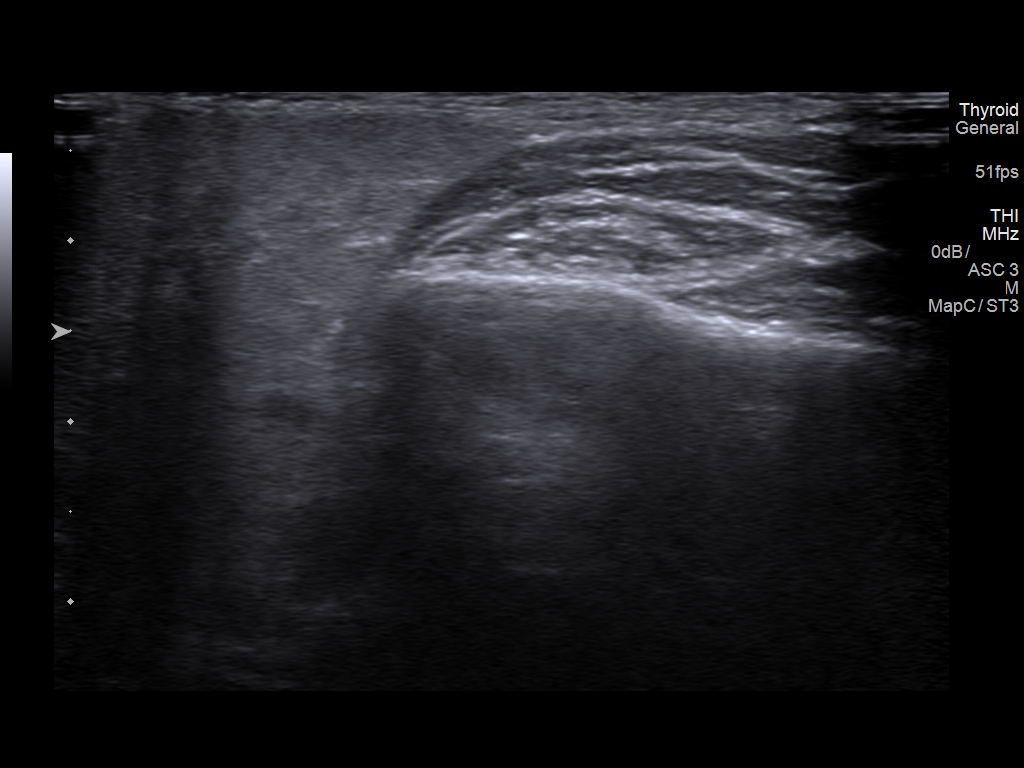

[7 of 7 positions shown; findings below may reference images not displayed]

FINDINGS: Grayscale and color duplex ultrasound performed in the region of
clinical concern.

In the region of the left parotid there is a soft tissue nodule
uniformly hypoechoic compared to adjacent parotid tissue and
musculature with through transmission and no internal cystic change.
No internal color flow. Dimensions approximately 2.1 cm x 0.8 cm x
1.7 cm.
IMPRESSION: Sonographic survey demonstrates soft tissue nodule within the left
parotid, with indeterminate characteristics. While most likely a
benign parotid mass, malignancy cannot be excluded. Referral for ENT
evaluation should be considered, as well as contrast-enhanced CT
imaging.

## 2018-10-26 ENCOUNTER — Ambulatory Visit (HOSPITAL_COMMUNITY)
Admission: EM | Admit: 2018-10-26 | Discharge: 2018-10-26 | Disposition: A | Discharge status: Home / Self Care | Payer: Self-pay | Attending: Family Medicine | Admitting: Family Medicine

## 2018-10-26 ENCOUNTER — Encounter (HOSPITAL_COMMUNITY): Payer: Self-pay | Admitting: Emergency Medicine

## 2018-10-26 DIAGNOSIS — J302 Other seasonal allergic rhinitis: Secondary | ICD-10-CM

## 2018-10-26 MED ORDER — BENZONATATE 100 MG PO CAPS: 100.0000 mg | ORAL_CAPSULE | Freq: Three times a day (TID) | ORAL | 0 refills | Status: DC

## 2018-10-26 MED ORDER — FLUTICASONE PROPIONATE 50 MCG/ACT NA SUSP: 2.0000 | Freq: Every day | NASAL | 0 refills | Status: DC

## 2018-10-26 MED ORDER — CETIRIZINE-PSEUDOEPHEDRINE ER 5-120 MG PO TB12: 1.0000 | ORAL_TABLET | Freq: Every day | ORAL | 0 refills | Status: DC

## 2018-10-26 NOTE — ED Provider Notes (Signed)
[place]   [id number] 10/26/18 Arrival Time: 8338   CC: URI symptoms   SUBJECTIVE: History from: patient.  Melvin Michael is a 63 y.o. male who presents with runny nose, nasal congestion, tickle in throat, and mild productive cough x a few weeks.  Admits to sick exposure to flu and PNA.  No known exposure to COVID or to anyone who is in quarantine for possible COVID.  Denies recent travel.  Attributes symptoms to seasonal allergies, and working outside more frequently.  Has tried OTC medications with relief.  Symptoms are intermittent throughout the day.  Reports previous symptoms in the past related to allergies.   Denies fever, chills, fatigue, sinus pain, SOB, wheezing, chest pain, nausea, changes in bowel or bladder habits.    ROS: As per HPI.  Past Medical History:  Diagnosis Date  . Arthritis   . TB (pulmonary tuberculosis)    Past Surgical History:  Procedure Laterality Date  . APPENDECTOMY    . HERNIA REPAIR     No Known Allergies Current Facility-Administered Medications on File Prior to Encounter  Medication Dose Route Frequency Provider Last Rate Last Dose  . 0.9 %  sodium chloride infusion  500 mL Intravenous Continuous Nandigam, Venia Minks, MD       Current Outpatient Medications on File Prior to Encounter  Medication Sig Dispense Refill  . aspirin EC 81 MG tablet Take 81 mg by mouth daily.    Marland Kitchen ibuprofen (ADVIL,MOTRIN) 800 MG tablet Take 1 tablet (800 mg total) by mouth 3 (three) times daily. 30 tablet 0  . lisinopril (PRINIVIL,ZESTRIL) 5 MG tablet TK 1 T PO D FOR HIGH BP    . Multiple Vitamin (MULTIVITAMIN) tablet Take 1 tablet by mouth daily.     Social History   Socioeconomic History  . Marital status: Single    Spouse name: Not on file  . Number of children: Not on file  . Years of education: Not on file  . Highest education level: Not on file  Occupational History  . Not on file  Social Needs  . Financial resource strain: Not on file  .  Food insecurity:    Worry: Not on file    Inability: Not on file  . Transportation needs:    Medical: Not on file    Non-medical: Not on file  Tobacco Use  . Smoking status: Former Smoker    Last attempt to quit: 07/31/2001    Years since quitting: 17.2  . Smokeless tobacco: Former Network engineer and Sexual Activity  . Alcohol use: Yes    Alcohol/week: 12.0 standard drinks    Types: 12 Cans of beer per week  . Drug use: Not Currently    Types: Cocaine, Marijuana    Comment: "many years ago"  . Sexual activity: Not on file  Lifestyle  . Physical activity:    Days per week: Not on file    Minutes per session: Not on file  . Stress: Not on file  Relationships  . Social connections:    Talks on phone: Not on file    Gets together: Not on file    Attends religious service: Not on file    Active member of club or organization: Not on file    Attends meetings of clubs or organizations: Not on file    Relationship status: Not on file  . Intimate partner violence:    Fear of current or ex partner: Not on file    Emotionally  abused: Not on file    Physically abused: Not on file    Forced sexual activity: Not on file  Other Topics Concern  . Not on file  Social History Narrative  . Not on file   Family History  Problem Relation Age of Onset  . Hypertension Mother   . Cancer Mother   . Colon cancer Neg Hx   . Esophageal cancer Neg Hx   . Rectal cancer Neg Hx   . Stomach cancer Neg Hx     OBJECTIVE:  Vitals:   10/26/18 1626 10/26/18 1627  BP:  123/80  Pulse: 86   Resp: 18   Temp: 98.1 F (36.7 C)   SpO2: 100%      General appearance: alert; well-appearing, nontoxic; speaking in full sentences and tolerating own secretions HEENT: NCAT; Ears: EACs clear, TMs pearly gray; Eyes: PERRL.  EOM grossly intact. Nose: nares patent without rhinorrhea, Throat: oropharynx clear, tonsils non erythematous or enlarged, uvula midline  Neck: supple without LAD Lungs: unlabored  respirations, symmetrical air entry; cough: absent; no respiratory distress; CTAB Heart: regular rate and rhythm.  Radial pulses 2+ symmetrical bilaterally Skin: warm and dry Psychological: alert and cooperative; normal mood and affect  ASSESSMENT & PLAN:  1. Seasonal allergies     Meds ordered this encounter  Medications  . cetirizine-pseudoephedrine (ZYRTEC-D) 5-120 MG tablet    Sig: Take 1 tablet by mouth daily.    Dispense:  30 tablet    Refill:  0    Order Specific Question:   Supervising Provider    Answer:   Raylene Everts [[id number]]  . fluticasone (FLONASE) 50 MCG/ACT nasal spray    Sig: Place 2 sprays into both nostrils daily.    Dispense:  16 g    Refill:  0    Order Specific Question:   Supervising Provider    Answer:   Raylene Everts [[id number]]  . benzonatate (TESSALON) 100 MG capsule    Sig: Take 1 capsule (100 mg total) by mouth every 8 (eight) hours.    Dispense:  21 capsule    Refill:  0    Order Specific Question:   Supervising Provider    Answer:   Raylene Everts [[id number]]   Symptoms most likely related to allergies Get plenty of rest and push fluids Tessalon Perles prescribed for cough Zyrtec-D prescribed for nasal congestion, runny nose, and/or sore throat Flonase prescribed for nasal congestion and runny nose Use medications daily for symptom relief Follow up with PCP if symptoms persist Call or go to ER if you have any new or worsening symptoms fever, chills, nausea, vomiting, chest pain, chest pressure, worsening cough, shortness of breath, wheezing, abdominal pain, changes in bowel or bladder habits, etc...  Reviewed expectations re: course of current medical issues. Questions answered. Outlined signs and symptoms indicating need for more acute intervention. Patient verbalized understanding. After Visit Summary given.         Lestine Box, PA-C 10/26/18 1652

## 2018-10-26 NOTE — Discharge Instructions (Addendum)
Symptoms most likely related to allergies Get plenty of rest and push fluids Tessalon Perles prescribed for cough Zyrtec-D prescribed for nasal congestion, runny nose, and/or sore throat Flonase prescribed for nasal congestion and runny nose Use medications daily for symptom relief Follow up with PCP or with Molli Barrows FNP if symptoms persist Call or go to ER if you have any new or worsening symptoms fever, chills, nausea, vomiting, chest pain, chest pressure, worsening cough, shortness of breath, wheezing, abdominal pain, changes in bowel or bladder habits, etc..Marland Kitchen

## 2018-10-26 NOTE — ED Triage Notes (Signed)
Pt c/o cough for several weeks and sore throat for a few days. No fever.

## 2018-11-13 ENCOUNTER — Encounter (HOSPITAL_COMMUNITY): Payer: Self-pay | Admitting: Emergency Medicine

## 2018-11-13 ENCOUNTER — Ambulatory Visit (HOSPITAL_COMMUNITY)
Admission: EM | Admit: 2018-11-13 | Discharge: 2018-11-13 | Disposition: A | Discharge status: Home / Self Care | Payer: Self-pay | Attending: Family Medicine | Admitting: Family Medicine

## 2018-11-13 ENCOUNTER — Other Ambulatory Visit: Payer: Self-pay

## 2018-11-13 DIAGNOSIS — R1031 Right lower quadrant pain: Secondary | ICD-10-CM

## 2018-11-13 LAB — POCT URINALYSIS DIP (DEVICE)
Bilirubin Urine: NEGATIVE
Glucose, UA: NEGATIVE mg/dL
Hgb urine dipstick: NEGATIVE
Ketones, ur: NEGATIVE mg/dL
Leukocytes,Ua: NEGATIVE
Nitrite: NEGATIVE
Protein, ur: NEGATIVE mg/dL
Specific Gravity, Urine: <=1.005 (ref 1.005–1.030)
Urobilinogen, UA: 0.2 mg/dL (ref 0.0–1.0)
pH: 5.5 (ref 5.0–8.0)

## 2018-11-13 MED ORDER — NAPROXEN 375 MG PO TABS: 375.0000 mg | ORAL_TABLET | Freq: Two times a day (BID) | ORAL | 0 refills | Status: DC

## 2018-11-13 NOTE — ED Notes (Signed)
chaperone  for provider exam

## 2018-11-13 NOTE — ED Provider Notes (Signed)
[place]   [id number] 11/13/18 Arrival Time: 1100  CC: RT groin pain  SUBJECTIVE: History from: patient. Melvin Michael is a 63 y.o. male complains of RT groin pain that began yesterday.  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  Localizes the pain to the RT flank, groin, and testicle.  Describes the pain as intermittent and sharp in character.  2-8/10.  Symptoms are made worse with walking.  Denies similar symptoms in the past.  Denies fever, chills, abdominal pain, nausea, vomiting, increased urinary frequency, hematuria, recent sexual activity, penile discharge, testicular swelling, testicular lumps or bumps.    No LMP for male patient.  ROS: As per HPI.  Past Medical History:  Diagnosis Date  . Arthritis   . TB (pulmonary tuberculosis)    Past Surgical History:  Procedure Laterality Date  . APPENDECTOMY    . HERNIA REPAIR     No Known Allergies Current Facility-Administered Medications on File Prior to Encounter  Medication Dose Route Frequency Provider Last Rate Last Dose  . 0.9 %  sodium chloride infusion  500 mL Intravenous Continuous Nandigam, Venia Minks, MD       Current Outpatient Medications on File Prior to Encounter  Medication Sig Dispense Refill  . aspirin EC 81 MG tablet Take 81 mg by mouth daily.    Marland Kitchen ibuprofen (ADVIL,MOTRIN) 800 MG tablet Take 1 tablet (800 mg total) by mouth 3 (three) times daily. 30 tablet 0  . lisinopril (PRINIVIL,ZESTRIL) 5 MG tablet TK 1 T PO D FOR HIGH BP    . Multiple Vitamin (MULTIVITAMIN) tablet Take 1 tablet by mouth daily.    Marland Kitchen tiZANidine (ZANAFLEX) 4 MG tablet TK 1 T PO Q NIGHT FOR MUSCLE SPASM     Social History   Socioeconomic History  . Marital status: Single    Spouse name: Not on file  . Number of children: Not on file  . Years of education: Not on file  . Highest education level: Not on file  Occupational History  . Not on file  Social Needs  . Financial  resource strain: Not on file  . Food insecurity:    Worry: Not on file    Inability: Not on file  . Transportation needs:    Medical: Not on file    Non-medical: Not on file  Tobacco Use  . Smoking status: Former Smoker    Last attempt to quit: 07/31/2001    Years since quitting: 17.2  . Smokeless tobacco: Former Network engineer and Sexual Activity  . Alcohol use: Yes    Alcohol/week: 12.0 standard drinks    Types: 12 Cans of beer per week  . Drug use: Not Currently    Types: Cocaine, Marijuana    Comment: "many years ago"  . Sexual activity: Not on file  Lifestyle  . Physical activity:    Days per week: Not on file    Minutes per session: Not on file  . Stress: Not on file  Relationships  . Social connections:    Talks on phone: Not on file    Gets together: Not on file    Attends religious service: Not on file    Active member of club or organization: Not on file    Attends meetings of clubs or organizations: Not on file    Relationship status: Not on file  . Intimate partner violence:    Fear of current or ex partner: Not on file  Emotionally abused: Not on file    Physically abused: Not on file    Forced sexual activity: Not on file  Other Topics Concern  . Not on file  Social History Narrative  . Not on file   Family History  Problem Relation Age of Onset  . Hypertension Mother   . Cancer Mother   . Colon cancer Neg Hx   . Esophageal cancer Neg Hx   . Rectal cancer Neg Hx   . Stomach cancer Neg Hx     OBJECTIVE:  Vitals:   11/13/18 1125  BP: 110/75  Pulse: 79  Resp: 18  Temp: (!) 97.5 F (36.4 C)  TempSrc: Oral  SpO2: 97%     General appearance: alert, NAD, appears stated age Head: NCAT Throat: lips, mucosa, and tongue normal; teeth and gums normal Lungs: CTA bilaterally without adventitious breath sounds Heart: regular rate and rhythm.  Radial pulses 2+ symmetrical bilaterally Back: no CVA tenderness Abdomen: soft, non-tender; bowel sounds  normal; no guarding GU: Chaperone KIM RN present. No obvious rashes or lesions, possible fullness over RT inguinal region; RT testicle and inguinal canal TTP, no hernia appreciated on exam; negative phren's sign; no obvious testicular swelling; testicles appear symmetrical in size Skin: warm and dry Psychological:  Alert and cooperative. Normal mood and affect.  Results for orders placed or performed during the hospital encounter of 11/13/18 (from the past 24 hour(s))  POCT urinalysis dip (device)     Status: None   Collection Time: 11/13/18 11:54 AM  Result Value Ref Range   Glucose, UA NEGATIVE NEGATIVE mg/dL   Bilirubin Urine NEGATIVE NEGATIVE   Ketones, ur NEGATIVE NEGATIVE mg/dL   Specific Gravity, Urine <=1.005 1.005 - 1.030   Hgb urine dipstick NEGATIVE NEGATIVE   pH 5.5 5.0 - 8.0   Protein, ur NEGATIVE NEGATIVE mg/dL   Urobilinogen, UA 0.2 0.0 - 1.0 mg/dL   Nitrite NEGATIVE NEGATIVE   Leukocytes,Ua NEGATIVE NEGATIVE     ASSESSMENT & PLAN:  1. Right groin pain     Meds ordered this encounter  Medications  . naproxen (NAPROSYN) 375 MG tablet    Sig: Take 1 tablet (375 mg total) by mouth 2 (two) times daily.    Dispense:  20 tablet    Refill:  0    Order Specific Question:   Supervising Provider    Answer:   Raylene Everts [[id number]]    Pending: Labs Reviewed  POCT URINALYSIS DIP (DEVICE)    Urine did not show signs of infection or of kidney stone Testicular US ordered for further evaluation.   If [place] is negative please follow up with PCP or general surgeon for further evaluation and management If you do not have a PCP, you may follow up with Molli Barrows FNP for further evaluation and management Go to the ED if you have any new or worsening symptoms such as fever, chills, nausea, vomiting, worsening groin or testicular pain, testicular swelling, etc...  Naproxen prescribed for pain.  Confirmed pharmacy on file.    Reviewed expectations re: course of current  medical issues. Questions answered. Outlined signs and symptoms indicating need for more acute intervention. Patient verbalized understanding. After Visit Summary given.       Lestine Box, PA-C 11/13/18 1232

## 2018-11-13 NOTE — ED Triage Notes (Signed)
Pt states yesterday he started having pain on his L groin area after straining with equipment at work. Pt painful with movement and tender to sit down.

## 2018-11-13 NOTE — Discharge Instructions (Signed)
Urine did not show signs of infection or of kidney stone Testicular US ordered for further evaluation.   If [place] is negative please follow up with PCP or general surgeon for further evaluation and management If you do not have a PCP, you may follow up with Melvin Barrows FNP for further evaluation and management Go to the ED if you have any new or worsening symptoms such as fever, chills, nausea, vomiting, worsening groin or testicular pain, testicular swelling, etc..Marland Kitchen

## 2018-11-14 ENCOUNTER — Telehealth (HOSPITAL_COMMUNITY): Payer: Self-pay

## 2018-11-14 NOTE — Telephone Encounter (Signed)
Patient verified his name and birthday before the following was discussed via telephone: Patient notified of testicular ultrasound appt change. Patient verbalized and repeated back to this RN that his new appointment is Monday, 11/18/2018 at 1:00 pm at the [facility] location and that he will arrive 15 minutes early.

## 2018-11-15 ENCOUNTER — Other Ambulatory Visit: Payer: Self-pay

## 2018-11-18 ENCOUNTER — Other Ambulatory Visit (HOSPITAL_COMMUNITY): Payer: Self-pay | Admitting: Family Medicine

## 2018-11-18 ENCOUNTER — Other Ambulatory Visit: Payer: Self-pay

## 2018-11-18 ENCOUNTER — Inpatient Hospital Stay: Admission: RE | Admit: 2018-11-18 | Payer: Self-pay | Source: Ambulatory Visit

## 2018-11-18 DIAGNOSIS — N50819 Testicular pain, unspecified: Secondary | ICD-10-CM

## 2019-08-14 IMAGING — DX DG THORACIC SPINE 2V
2 series · 2 of 2 positions shown · non-contrast
Comparison: Chest radiographs dated 06/30/2005

CLINICAL DATA: Fall, back pain

EXAM:
THORACIC SPINE 2 VIEWS

[t-spine ap]
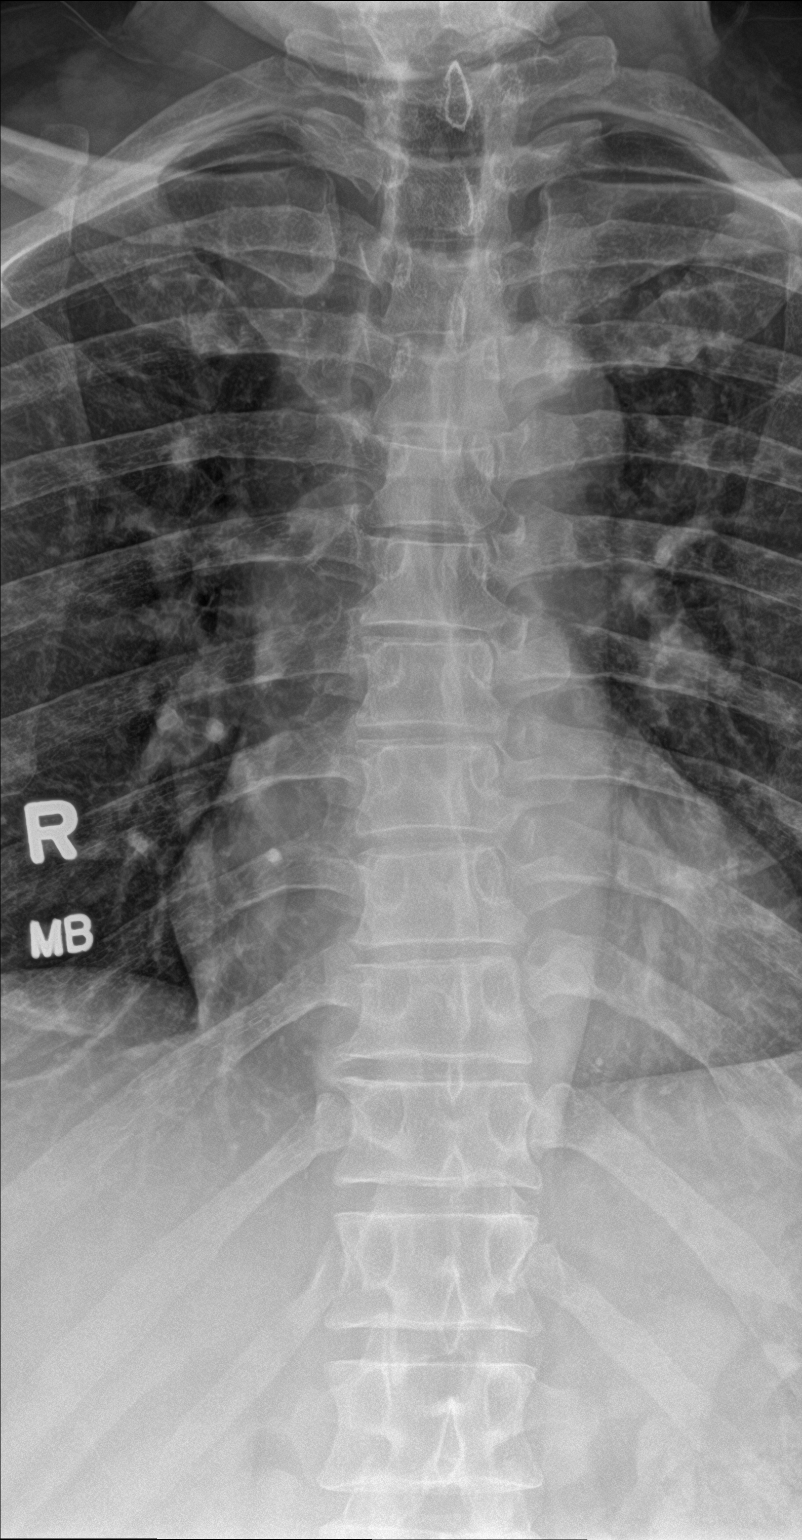

[t-spine lat]
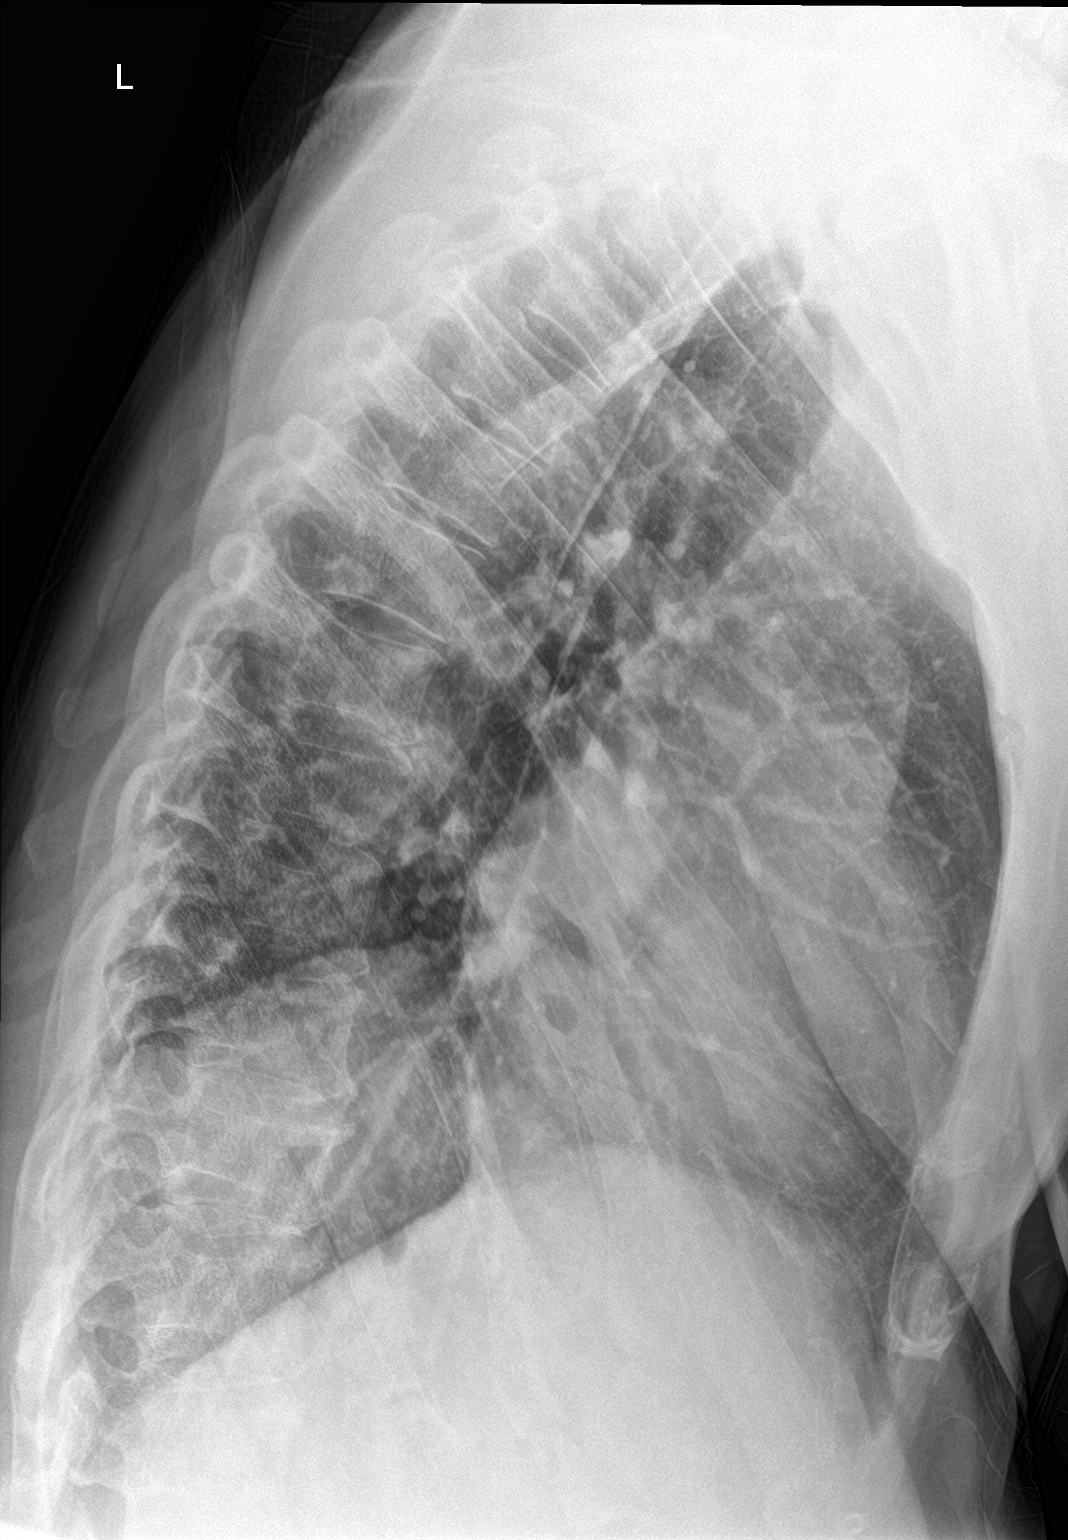

[2 of 2 positions shown; findings below may reference images not displayed]

FINDINGS: Normal thoracic kyphosis.

No evidence of fracture or dislocation. Vertebral body heights and
intervertebral disc spaces are maintained.

Mild degenerative changes of the lower thoracic spine.

Visualized lung apices are clear.
IMPRESSION: Negative.

## 2021-03-31 ENCOUNTER — Encounter (HOSPITAL_COMMUNITY): Payer: Self-pay

## 2021-03-31 ENCOUNTER — Other Ambulatory Visit: Payer: Self-pay

## 2021-03-31 ENCOUNTER — Ambulatory Visit (HOSPITAL_COMMUNITY)
Admission: RE | Admit: 2021-03-31 | Discharge: 2021-03-31 | Disposition: A | Discharge status: Home / Self Care | Payer: Medicare Other | Source: Ambulatory Visit

## 2021-03-31 VITALS — BP 116/77 | HR 73 | Temp 98.6°F | Resp 17

## 2021-03-31 DIAGNOSIS — M791 Myalgia, unspecified site: Secondary | ICD-10-CM | POA: Diagnosis not present

## 2021-03-31 MED ORDER — METHOCARBAMOL 500 MG PO TABS: 500.0000 mg | ORAL_TABLET | Freq: Two times a day (BID) | ORAL | 0 refills | Status: DC

## 2021-03-31 NOTE — ED Triage Notes (Signed)
Pt in with c/o bilateral shoulder pain that has been going on for a few months  States the right side is worse and he cannot sleep on either side without severe pain.  Pt has used muscle relaxer's in the past which he states have helped with the pain

## 2021-03-31 NOTE — ED Provider Notes (Signed)
[facility]    CSN: [id number] Arrival date & time: 03/31/21  [id number]      History   Chief Complaint Chief Complaint  Patient presents with   Shoulder Pain    HPI Melvin Michael is a 65 y.o. male.   HPI  Shoulder Pain: Patient reports chronic bilateral shoulder pain.  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  He states that over the years this is caused him some muscle tightness and arthritis.  Usually managed well with muscle relaxers although he does not have any currently.  He reports that the last time he was given muscle relaxers he was given tizanidine and this was not beneficial to help with his pain.  He states that most of his pain occurs at night when he tries to sleep.  He denies any neuro changes, weakness, tingling or injury.   Past Medical History:  Diagnosis Date   Arthritis    TB (pulmonary tuberculosis)     Patient Active Problem List   Diagnosis Date Noted   TB (pulmonary tuberculosis)     Past Surgical History:  Procedure Laterality Date   APPENDECTOMY     HERNIA REPAIR         Home Medications    Prior to Admission medications   Medication Sig Start Date End Date Taking? Authorizing Provider  atorvastatin (LIPITOR) 40 MG tablet TAKE 1 TABLET BY MOUTH AT BEDTIME FOR HIGH CHOLESTEROL 11/10/19  Yes [provider]  aspirin EC 81 MG tablet Take 81 mg by mouth daily.    [provider]  ibuprofen (ADVIL,MOTRIN) 800 MG tablet Take 1 tablet (800 mg total) by mouth 3 (three) times daily. 01/12/18   Wieters, Hallie C, PA-C  lisinopril (PRINIVIL,ZESTRIL) 5 MG tablet TK 1 T PO D FOR HIGH BP 10/16/18   [provider]  Multiple Vitamin (MULTIVITAMIN) tablet Take 1 tablet by mouth daily.    [provider]  naproxen (NAPROSYN) 375 MG tablet Take 1 tablet (375 mg total) by mouth 2 (two) times daily. 11/13/18   Wurst,  [place], PA-C  tiZANidine (ZANAFLEX) 4 MG tablet TK 1 T PO Q NIGHT FOR MUSCLE SPASM 11/01/18   [provider]    Family History Family History  Problem Relation Age of Onset   Hypertension Mother    Cancer Mother    Colon cancer Neg Hx    Esophageal cancer Neg Hx    Rectal cancer Neg Hx    Stomach cancer Neg Hx     Social History Social History   Tobacco Use   Smoking status: Former    Types: Cigarettes    Quit date: 07/31/2001    Years since quitting: 19.6   Smokeless tobacco: Former  Scientific laboratory technician Use: Never used  Substance Use Topics   Alcohol use: Yes    Alcohol/week: 12.0 standard drinks    Types: 12 Cans of beer per week   Drug use: Not Currently    Types: Cocaine, Marijuana    Comment: "many years ago"     Allergies   Patient has no known allergies.   Review of Systems Review of Systems  As stated above in HPI Physical Exam Triage Vital Signs ED Triage Vitals  Enc Vitals Group     BP 03/31/21 0953 116/77     Pulse Rate 03/31/21 0953 73  Resp 03/31/21 0953 17     Temp 03/31/21 0953 98.6 F (37 C)     Temp Source 03/31/21 0953 Oral     SpO2 03/31/21 0953 94 %     Weight --      Height --      Head Circumference --      Peak Flow --      Pain Score 03/31/21 0952 7     Pain Loc --      Pain Edu? --      Excl. in [place]? --    No data found.  Updated Vital Signs BP 116/77 (BP Location: Right Arm)   Pulse 73   Temp 98.6 F (37 C) (Oral)   Resp 17   SpO2 94%   Physical Exam Vitals and nursing note reviewed.  Constitutional:      General: He is not in acute distress.    Appearance: Normal appearance. He is not ill-appearing, toxic-appearing or diaphoretic.  Cardiovascular:     Rate and Rhythm: Normal rate and regular rhythm.     Heart sounds: Normal heart sounds.  Pulmonary:     Effort: Pulmonary effort is normal.     Breath sounds: Normal breath sounds.  Musculoskeletal:        General: Tenderness (trapezius muscle  throughout bilaterally with muscle spasms.) present. No swelling. Normal range of motion.  Skin:    General: Skin is warm.     Findings: No rash.  Neurological:     Mental Status: He is alert.     UC Treatments / Results  Labs (all labs ordered are listed, but only abnormal results are displayed) Labs Reviewed - No data to display  EKG   Radiology No results found.  Procedures Procedures (including critical care time)  Medications Ordered in UC Medications - No data to display  Initial Impression / Assessment and Plan / UC Course  I have reviewed the triage vital signs and the nursing notes.  Pertinent labs & imaging results that were available during my care of the patient were reviewed by me and considered in my medical decision making (see chart for details).     New.  Treating with methocarbamol as this is a lower risk given his age than other muscle relaxers but is different than the tizanidine he has tried in the past.  Discussed how to use and precautions.  He can continue using things like heating pad compresses and I have recommended physical therapy to patient.  Discussed red flag signs symptoms. Final Clinical Impressions(s) / UC Diagnoses   Final diagnoses:  None   Discharge Instructions   None    ED Prescriptions   None    PDMP not reviewed this encounter.   Hughie Closs, [place] 03/31/21 1027

## 2021-05-24 ENCOUNTER — Other Ambulatory Visit: Payer: Self-pay | Admitting: Orthopaedic Surgery

## 2021-05-24 ENCOUNTER — Other Ambulatory Visit: Payer: Self-pay | Admitting: Orthopedic Surgery

## 2021-05-24 DIAGNOSIS — M542 Cervicalgia: Secondary | ICD-10-CM

## 2021-05-24 DIAGNOSIS — M5412 Radiculopathy, cervical region: Secondary | ICD-10-CM

## 2021-05-24 DIAGNOSIS — M545 Low back pain, unspecified: Secondary | ICD-10-CM

## 2021-06-05 ENCOUNTER — Inpatient Hospital Stay: Admission: RE | Admit: 2021-06-05 | Payer: Medicare Other | Source: Ambulatory Visit

## 2021-06-05 ENCOUNTER — Other Ambulatory Visit: Payer: Medicare Other

## 2021-06-30 ENCOUNTER — Other Ambulatory Visit: Payer: Self-pay

## 2021-06-30 ENCOUNTER — Ambulatory Visit (HOSPITAL_COMMUNITY)
Admission: EM | Admit: 2021-06-30 | Discharge: 2021-06-30 | Disposition: A | Discharge status: Home / Self Care | Payer: Medicare Other | Attending: Emergency Medicine | Admitting: Emergency Medicine

## 2021-06-30 ENCOUNTER — Encounter (HOSPITAL_COMMUNITY): Payer: Self-pay | Admitting: Emergency Medicine

## 2021-06-30 DIAGNOSIS — M62830 Muscle spasm of back: Secondary | ICD-10-CM

## 2021-06-30 DIAGNOSIS — S39012A Strain of muscle, fascia and tendon of lower back, initial encounter: Secondary | ICD-10-CM

## 2021-06-30 MED ORDER — IBUPROFEN 600 MG PO TABS: 600.0000 mg | ORAL_TABLET | Freq: Four times a day (QID) | ORAL | 0 refills | Status: AC | PRN

## 2021-06-30 MED ORDER — TIZANIDINE HCL 4 MG PO TABS: 4.0000 mg | ORAL_TABLET | Freq: Three times a day (TID) | ORAL | 0 refills | Status: DC | PRN

## 2021-06-30 MED ORDER — METHYLPREDNISOLONE 4 MG PO TBPK: ORAL_TABLET | Freq: Every day | ORAL | 0 refills | Status: AC

## 2021-06-30 NOTE — Discharge Instructions (Addendum)
600 mg of ibuprofen combined with 1000 mg of Tylenol 3-4 times a day as needed.  Try the Medrol Dosepak for swelling and the Zanaflex for muscle spasms.  Epsom salt soaks, heating pad.  Many people find gentle stretching and deep tissue massage helpful. Try [facility] on [address]. They have very reasonable prices and take walk ins. Or you can go to  [facility]. Follow-up with your primary care physician in several days as needed, go to the ER for the signs and symptoms we discussed.  Go to www.goodrx.com  or www.costplusdrugs.com to look up your medications. This will give you a list of where you can find your prescriptions at the most affordable prices. Or ask the pharmacist what the cash price is, or if they have any other discount programs available to help make your medication more affordable. This can be less expensive than what you would pay with insurance.

## 2021-06-30 NOTE — ED Provider Notes (Signed)
HPI  SUBJECTIVE:  Melvin Michael is a 65 y.o. male who presents with bilateral sore, constant low back pain starting this morning after pulling on a heavy sump pump.  He states that it radiates into his left hip and right lateral leg.  He denies fevers, saddle anesthesia, urinary or fecal incontinence, urinary retention, distal numbness or tingling, leg weakness, bilateral radicular pain, direct trauma to the back or fall.  He tried Tylenol, lying down flat, heat.  Symptoms are better with heat and lying still, worse with turning over, large positional changes, bending forward and torso rotation.  He has a remote history of TB, treated, and arthritis.  He has a history of back injury and states that the symptoms are identical to that episode.  No history of chronic kidney disease, GI bleed, peptic ulcer disease.  PMD: [facility] clinic.   Past Medical History:  Diagnosis Date   Arthritis    TB (pulmonary tuberculosis)     Past Surgical History:  Procedure Laterality Date   APPENDECTOMY     HERNIA REPAIR      Family History  Problem Relation Age of Onset   Hypertension Mother    Cancer Mother    Colon cancer Neg Hx    Esophageal cancer Neg Hx    Rectal cancer Neg Hx    Stomach cancer Neg Hx     Social History   Tobacco Use   Smoking status: Former    Types: Cigarettes    Quit date: 07/31/2001    Years since quitting: 19.9   Smokeless tobacco: Former  Scientific laboratory technician Use: Never used  Substance Use Topics   Alcohol use: Yes    Alcohol/week: 12.0 standard drinks    Types: 12 Cans of beer per week   Drug use: Not Currently    Types: Cocaine, Marijuana    Comment: "many years ago"     Current Facility-Administered Medications:    0.9 %  sodium chloride infusion, 500 mL, Intravenous, Continuous, Nandigam, Kavitha V, MD  Current Outpatient Medications:    ibuprofen (ADVIL) 600 MG tablet, Take 1 tablet (600 mg total) by mouth every 6 (six) hours as needed., Disp: 30  tablet, Rfl: 0   methylPREDNISolone (MEDROL DOSEPAK) 4 MG TBPK tablet, Take by mouth daily. Follow package instructions, Disp: 21 tablet, Rfl: 0   tiZANidine (ZANAFLEX) 4 MG tablet, Take 1 tablet (4 mg total) by mouth every 8 (eight) hours as needed for muscle spasms., Disp: 30 tablet, Rfl: 0   aspirin EC 81 MG tablet, Take 81 mg by mouth daily., Disp: , Rfl:    atorvastatin (LIPITOR) 40 MG tablet, TAKE 1 TABLET BY MOUTH AT BEDTIME FOR HIGH CHOLESTEROL, Disp: , Rfl:    lisinopril (PRINIVIL,ZESTRIL) 5 MG tablet, TK 1 T PO D FOR HIGH BP, Disp: , Rfl:    Multiple Vitamin (MULTIVITAMIN) tablet, Take 1 tablet by mouth daily., Disp: , Rfl:   No Known Allergies   ROS  As noted in HPI.   Physical Exam  BP 121/83 (BP Location: Right Arm)   Pulse 73   Temp 98.4 F (36.9 C) (Oral)   Resp 18   SpO2 98%   Constitutional: Well developed, well nourished, no acute distress Eyes:  EOMI, conjunctiva normal bilaterally HENT: Normocephalic, atraumatic,mucus membranes moist Respiratory: Normal inspiratory effort Cardiovascular: Normal rate GI: nondistended. No suprapubic tenderness skin: No rash, skin intact Musculoskeletal: no CVAT. + Bilateral paralumbar tenderness, + muscle spasm. No L-spine, SI joint,  sacral bony tenderness. Bilateral lower extremities nontender, baseline ROM with intact PT pulses, pain with hip flexion against resistance bilaterally.  No  pain with int/ext rotation hips bilaterally. SLR neg bilaterally. Sensation baseline light touch bilaterally for Pt, DTR's symmetric and intact bilaterally KJ , Motor symmetric bilateral 5/5 hip flexion, quadriceps, hamstrings, EHL, foot dorsiflexion, foot plantarflexion.  Moving around comfortably. Neurologic: Alert & oriented x 3, no focal neuro deficits Psychiatric: Speech and behavior appropriate   ED Course   Medications - No data to display  No orders of the defined types were placed in this encounter.   No results found for this  or any previous visit (from the past 24 hour(s)). No results found.  ED Clinical Impression  1. Strain of lumbar region, initial encounter   2. Muscle spasm of back    ED Assessment/Plan  Presentation most consistent with acute lumbar strain.  No evidence of spinal cord involvement based on H&P. Pt describing typical back pain, has been < 6 week duration. No historical red flags as noted in HPI. No physical red flags such as fever, bony tenderness, lower extremity weakness, saddle anesthesia. Imaging not indicated at this time.    Home with Tylenol/ibuprofen, Zanaflex, Medrol Dosepak, gentle stretching, Epsom salt soaks, heating pad, deep tissue massage, work note for tomorrow.. Pt to f/u with PMD.  Discussed medical decision-making, and plan for follow-up with the patient.  Discussed signs and symptoms that should prompt return to the emergency department.  Patient agrees with plan.  .   Meds ordered this encounter  Medications   ibuprofen (ADVIL) 600 MG tablet    Sig: Take 1 tablet (600 mg total) by mouth every 6 (six) hours as needed.    Dispense:  30 tablet    Refill:  0   methylPREDNISolone (MEDROL DOSEPAK) 4 MG TBPK tablet    Sig: Take by mouth daily. Follow package instructions    Dispense:  21 tablet    Refill:  0   tiZANidine (ZANAFLEX) 4 MG tablet    Sig: Take 1 tablet (4 mg total) by mouth every 8 (eight) hours as needed for muscle spasms.    Dispense:  30 tablet    Refill:  0    *This clinic note was created using [employer]. Therefore, there may be occasional mistakes despite careful proofreading.  ?     Melynda Ripple, MD 07/02/21 [phone]

## 2021-06-30 NOTE — ED Triage Notes (Signed)
Pt c/o lower back pains that radiates down right leg some since yesterday. Reports was pulling a pump out of a hole. Denies bowel or urinary problems.

## 2021-09-03 ENCOUNTER — Ambulatory Visit (HOSPITAL_COMMUNITY): Payer: Self-pay

## 2021-09-04 ENCOUNTER — Ambulatory Visit (HOSPITAL_COMMUNITY): Payer: Self-pay

## 2022-05-10 ENCOUNTER — Encounter: Payer: Self-pay | Admitting: Gastroenterology

## 2022-05-23 ENCOUNTER — Other Ambulatory Visit: Payer: Self-pay | Admitting: Orthopaedic Surgery

## 2022-05-23 DIAGNOSIS — M545 Low back pain, unspecified: Secondary | ICD-10-CM

## 2022-05-26 ENCOUNTER — Telehealth: Payer: Self-pay

## 2022-05-26 NOTE — Telephone Encounter (Signed)
Attempted to call patient twice, no voice mail available. Patient miss PV appt.  If no call back by 5pm will send no show letter and cancel colonoscopy and pre visit appt.

## 2022-06-27 ENCOUNTER — Encounter: Payer: Medicare (Managed Care) | Admitting: Gastroenterology

## 2022-09-28 ENCOUNTER — Encounter: Payer: Self-pay | Admitting: Gastroenterology

## 2023-04-15 ENCOUNTER — Ambulatory Visit: Payer: Medicare (Managed Care)

## 2023-05-16 DIAGNOSIS — I10 Essential (primary) hypertension: Secondary | ICD-10-CM | POA: Diagnosis not present

## 2023-05-16 DIAGNOSIS — M25512 Pain in left shoulder: Secondary | ICD-10-CM | POA: Diagnosis not present

## 2023-05-16 DIAGNOSIS — Z87891 Personal history of nicotine dependence: Secondary | ICD-10-CM | POA: Diagnosis not present

## 2023-05-26 ENCOUNTER — Ambulatory Visit (HOSPITAL_COMMUNITY): Payer: Medicare (Managed Care)

## 2023-07-02 ENCOUNTER — Ambulatory Visit (HOSPITAL_COMMUNITY)
Admission: EM | Admit: 2023-07-02 | Discharge: 2023-07-02 | Disposition: A | Discharge status: Home / Self Care | Payer: No Typology Code available for payment source | Attending: Family Medicine | Admitting: Family Medicine

## 2023-07-02 ENCOUNTER — Encounter (HOSPITAL_COMMUNITY): Payer: Self-pay | Admitting: Emergency Medicine

## 2023-07-02 ENCOUNTER — Ambulatory Visit (INDEPENDENT_AMBULATORY_CARE_PROVIDER_SITE_OTHER): Payer: No Typology Code available for payment source

## 2023-07-02 DIAGNOSIS — R5383 Other fatigue: Secondary | ICD-10-CM | POA: Diagnosis not present

## 2023-07-02 DIAGNOSIS — M545 Low back pain, unspecified: Secondary | ICD-10-CM | POA: Diagnosis not present

## 2023-07-02 DIAGNOSIS — B349 Viral infection, unspecified: Secondary | ICD-10-CM

## 2023-07-02 DIAGNOSIS — J189 Pneumonia, unspecified organism: Secondary | ICD-10-CM | POA: Diagnosis not present

## 2023-07-02 DIAGNOSIS — G8929 Other chronic pain: Secondary | ICD-10-CM

## 2023-07-02 DIAGNOSIS — R6883 Chills (without fever): Secondary | ICD-10-CM | POA: Diagnosis not present

## 2023-07-02 LAB — POC COVID19/FLU A&B COMBO
Covid Antigen, POC: NEGATIVE
Influenza A Antigen, POC: NEGATIVE
Influenza B Antigen, POC: NEGATIVE

## 2023-07-02 MED ORDER — PREDNISONE 20 MG PO TABS: 20.0000 mg | ORAL_TABLET | Freq: Every day | ORAL | 0 refills | Status: AC

## 2023-07-02 MED ORDER — TIZANIDINE HCL 4 MG PO TABS: 4.0000 mg | ORAL_TABLET | Freq: Two times a day (BID) | ORAL | 0 refills | Status: AC | PRN

## 2023-07-02 NOTE — Discharge Instructions (Addendum)
Symptoms appear to be viral in nature.  Your COVID and flu test were negative.  Obtaining a chest x-ray to rule out a pneumonia.  Once the radiologist has reviewed your chest x-ray if any treatment is warranted we will notify you by phone.  I am sending over prednisone to help with your back pain and tizanidine which is a muscle relaxer.  Take as directed.  If any additional medications are warranted after reviewing your chest x-ray these will also be sent to the pharmacy.

## 2023-07-02 NOTE — ED Provider Notes (Signed)
[facility]    CSN: [id number] Arrival date & time: 07/02/23  1241      History   Chief Complaint Chief Complaint  Patient presents with   Hypertension   Back Pain    HPI Melvin Michael is a 67 y.o. male.  Patient is today for evaluation of back pain however after further discussion patient is also having some generalized bodyaches which have been present over the last day or so.  Patient has a history of chronic low back pain which occasionally radiates into his lower extremities.  Patient also endorses chills overnight and he has a low-grade temp of 99.4 on arrival.  He reports 1 episode of vomiting since being here at the urgent care and intermittent nausea. He denies any active cough and cold symptoms, although endorses he generally feels unwell.  BP was elevated 200/88 however is currently labile at 132/74. Past Medical History:  Diagnosis Date   Arthritis    TB (pulmonary tuberculosis)     Patient Active Problem List   Diagnosis Date Noted   TB (pulmonary tuberculosis)     Past Surgical History:  Procedure Laterality Date   APPENDECTOMY     HERNIA REPAIR         Home Medications    Prior to Admission medications   Medication Sig Start Date End Date Taking? Authorizing Provider  predniSONE (DELTASONE) 20 MG tablet Take 1 tablet (20 mg total) by mouth daily with breakfast for 5 days. 07/02/23 07/07/23 Yes Bing Neighbors, NP  tiZANidine (ZANAFLEX) 4 MG tablet Take 1 tablet (4 mg total) by mouth 2 (two) times daily as needed for muscle spasms. 07/02/23  Yes Bing Neighbors, NP  aspirin EC 81 MG tablet Take 81 mg by mouth daily.    [provider]  atorvastatin (LIPITOR) 40 MG tablet TAKE 1 TABLET BY MOUTH AT BEDTIME FOR HIGH CHOLESTEROL 11/10/19   [provider]  ibuprofen (ADVIL) 600 MG tablet Take 1 tablet (600 mg total) by mouth every 6 (six) hours as needed. 06/30/21   Domenick Gong, MD  lisinopril (PRINIVIL,ZESTRIL) 5 MG  tablet TK 1 T PO D FOR HIGH BP 10/16/18   [provider]  methylPREDNISolone (MEDROL DOSEPAK) 4 MG TBPK tablet Take by mouth daily. Follow package instructions Patient not taking: Reported on 07/02/2023 06/30/21   Domenick Gong, MD  Multiple Vitamin (MULTIVITAMIN) tablet Take 1 tablet by mouth daily.    [provider]    Family History Family History  Problem Relation Age of Onset   Hypertension Mother    Cancer Mother    Colon cancer Neg Hx    Esophageal cancer Neg Hx    Rectal cancer Neg Hx    Stomach cancer Neg Hx     Social History Social History   Tobacco Use   Smoking status: Former    Current packs/day: 0.00    Types: Cigarettes    Quit date: 07/31/2001    Years since quitting: 21.9   Smokeless tobacco: Former  Building services engineer status: Never Used  Substance Use Topics   Alcohol use: Yes    Alcohol/week: 12.0 standard drinks of alcohol    Types: 12 Cans of beer per week   Drug use: Not Currently    Types: Cocaine, Marijuana    Comment: "many years ago"     Allergies   Patient has no known allergies.   Review of Systems Review of Systems  Musculoskeletal:  Positive for  back pain.     Physical Exam Triage Vital Signs ED Triage Vitals  Encounter Vitals Group     BP 07/02/23 1422 132/74     Systolic BP Percentile --      Diastolic BP Percentile --      Pulse Rate 07/02/23 1422 83     Resp 07/02/23 1422 17     Temp 07/02/23 1422 99.4 F (37.4 C)     Temp Source 07/02/23 1422 Oral     SpO2 07/02/23 1422 95 %     Weight --      Height --      Head Circumference --      Peak Flow --      Pain Score 07/02/23 1427 6     Pain Loc --      Pain Education --      Exclude from Growth Chart --    No data found.  Updated Vital Signs BP 132/74 (BP Location: Right Arm)   Pulse 83   Temp 99.4 F (37.4 C) (Oral)   Resp 17   SpO2 95%   Visual Acuity Right Eye Distance:   Left Eye Distance:   Bilateral Distance:    Right Eye  Near:   Left Eye Near:    Bilateral Near:     Physical Exam Vitals reviewed.  Constitutional:      General: He is not in acute distress.    Appearance: He is ill-appearing. He is not toxic-appearing.  HENT:     Head: Normocephalic and atraumatic.     Nose: Nose normal.  Eyes:     Extraocular Movements: Extraocular movements intact.     Pupils: Pupils are equal, round, and reactive to light.  Cardiovascular:     Rate and Rhythm: Normal rate.  Pulmonary:     Breath sounds: Examination of the right-middle field reveals decreased breath sounds. Examination of the left-middle field reveals decreased breath sounds. Examination of the right-lower field reveals decreased breath sounds. Examination of the left-lower field reveals decreased breath sounds. Decreased breath sounds present.  Skin:    General: Skin is warm and dry.  Neurological:     General: No focal deficit present.     Mental Status: He is alert.      UC Treatments / Results  Labs (all labs ordered are listed, but only abnormal results are displayed) Labs Reviewed  POC COVID19/FLU A&B COMBO    EKG   Radiology No results found.  Procedures Procedures (including critical care time)  Medications Ordered in UC Medications - No data to display  Initial Impression / Assessment and Plan / UC Course  I have reviewed the triage vital signs and the nursing notes.  Pertinent labs & imaging results that were available during my care of the patient were reviewed by me and considered in my medical decision making (see chart for details).    Patient with a 24-hour onset of chills, generalized bodyaches and generally feeling unwell presents today for evaluation on chronic back pain and 1 episode of elevated blood pressure at home.  Rapid COVID and flu test negative.  Chest x-ray pending with the radiologist to rule out a pneumonia.  Discussed with patient indication for rest, hydrate well with fluids and monitoring  temperature at home.  Advised that if chest x-ray things require any treatment we will notify him by phone.  Given patient's chronic back pain and that he is overdue for his steroid injection will cover with a  burst of prednisone and muscle relaxer for now.  Patient is to follow-up if any of his symptoms worsen or do not improve. Final Clinical Impressions(s) / UC Diagnoses   Final diagnoses:  Nonspecific syndrome suggestive of viral illness  Chronic bilateral low back pain without sciatica     Discharge Instructions      Symptoms appear to be viral in nature.  Your COVID and flu test were negative.  Obtaining a chest x-ray to rule out a pneumonia.  Once the radiologist has reviewed your chest x-ray if any treatment is warranted we will notify you by phone.  I am sending over prednisone to help with your back pain and tizanidine which is a muscle relaxer.  Take as directed.  If any additional medications are warranted after reviewing your chest x-ray these will also be sent to the pharmacy.     ED Prescriptions     Medication Sig Dispense Auth. Provider   predniSONE (DELTASONE) 20 MG tablet Take 1 tablet (20 mg total) by mouth daily with breakfast for 5 days. 5 tablet Bing Neighbors, NP   tiZANidine (ZANAFLEX) 4 MG tablet Take 1 tablet (4 mg total) by mouth 2 (two) times daily as needed for muscle spasms. 20 tablet Bing Neighbors, NP      PDMP not reviewed this encounter.   Bing Neighbors, NP 07/02/23 [phone]

## 2023-07-02 NOTE — ED Triage Notes (Addendum)
Pt c/o high blood pressure that was 200/80 this morning. He took his Bp medication right after that.   He also c/o bilateral arm pain and back pain for 3 days.   States he threw up while waiting in lobby today.

## 2024-05-12 ENCOUNTER — Ambulatory Visit (HOSPITAL_COMMUNITY)

## 2024-08-07 ENCOUNTER — Telehealth: Payer: Self-pay

## 2024-08-07 NOTE — Telephone Encounter (Signed)
 Attempted to reach patient concerning colonoscopy recall; unable to speak with patient;  left message and number to the office for patient to call back and schedule appts;
